# Patient Record
Sex: Male | Born: 1985 | Race: White | Hispanic: No | Smoking: Current every day smoker
Health system: Southern US, Community
[De-identification: ages and names within clinical notes are randomized; demographics above are authoritative.]

## PROBLEM LIST (undated history)

## (undated) ENCOUNTER — Emergency Department (HOSPITAL_COMMUNITY): Payer: Medicaid Other

## (undated) DIAGNOSIS — I10 Essential (primary) hypertension: Secondary | ICD-10-CM

## (undated) DIAGNOSIS — B1920 Unspecified viral hepatitis C without hepatic coma: Secondary | ICD-10-CM

---

## 2019-09-13 ENCOUNTER — Encounter (HOSPITAL_COMMUNITY): Payer: Self-pay | Admitting: Psychiatry

## 2019-09-13 ENCOUNTER — Inpatient Hospital Stay (HOSPITAL_COMMUNITY)
Admission: RE | Admit: 2019-09-13 | Discharge: 2019-09-18 | DRG: 885 | Disposition: A | Discharge status: Home / Self Care | Payer: Federal, State, Local not specified - Other | Attending: Psychiatry | Admitting: Psychiatry

## 2019-09-13 DIAGNOSIS — F333 Major depressive disorder, recurrent, severe with psychotic symptoms: Principal | ICD-10-CM | POA: Diagnosis present

## 2019-09-13 DIAGNOSIS — F15159 Other stimulant abuse with stimulant-induced psychotic disorder, unspecified: Secondary | ICD-10-CM | POA: Diagnosis present

## 2019-09-13 DIAGNOSIS — Z8249 Family history of ischemic heart disease and other diseases of the circulatory system: Secondary | ICD-10-CM

## 2019-09-13 DIAGNOSIS — F23 Brief psychotic disorder: Secondary | ICD-10-CM | POA: Diagnosis present

## 2019-09-13 DIAGNOSIS — F17210 Nicotine dependence, cigarettes, uncomplicated: Secondary | ICD-10-CM | POA: Diagnosis present

## 2019-09-13 DIAGNOSIS — Z20822 Contact with and (suspected) exposure to covid-19: Secondary | ICD-10-CM | POA: Diagnosis present

## 2019-09-13 DIAGNOSIS — G4700 Insomnia, unspecified: Secondary | ICD-10-CM | POA: Diagnosis present

## 2019-09-13 DIAGNOSIS — Z818 Family history of other mental and behavioral disorders: Secondary | ICD-10-CM

## 2019-09-13 DIAGNOSIS — I10 Essential (primary) hypertension: Secondary | ICD-10-CM | POA: Diagnosis present

## 2019-09-13 DIAGNOSIS — F323 Major depressive disorder, single episode, severe with psychotic features: Secondary | ICD-10-CM

## 2019-09-13 DIAGNOSIS — F600 Paranoid personality disorder: Secondary | ICD-10-CM | POA: Diagnosis present

## 2019-09-13 LAB — RESPIRATORY PANEL BY RT PCR (FLU A&B, COVID)
Influenza A by PCR: NEGATIVE
Influenza B by PCR: NEGATIVE
SARS Coronavirus 2 by RT PCR: NEGATIVE

## 2019-09-13 MED ORDER — TRAZODONE HCL 100 MG PO TABS
100.0000 mg | ORAL_TABLET | Freq: Every day | ORAL | Status: DC
  Filled 2019-09-13 (×2): qty 1

## 2019-09-13 MED ORDER — MAGNESIUM HYDROXIDE 400 MG/5ML PO SUSP: 30.0000 mL | Freq: Every day | ORAL | Status: DC | PRN

## 2019-09-13 MED ORDER — ACETAMINOPHEN 325 MG PO TABS
650.0000 mg | ORAL_TABLET | Freq: Four times a day (QID) | ORAL | Status: DC | PRN
  Administered 2019-09-15: 08:00:00 650 mg via ORAL
  Filled 2019-09-13: qty 2

## 2019-09-13 MED ORDER — ALUM & MAG HYDROXIDE-SIMETH 200-200-20 MG/5ML PO SUSP: 30.0000 mL | ORAL | Status: DC | PRN

## 2019-09-13 MED ORDER — NICOTINE 21 MG/24HR TD PT24
21.0000 mg | MEDICATED_PATCH | Freq: Every day | TRANSDERMAL | Status: DC
  Administered 2019-09-14 – 2019-09-15 (×2): 21 mg via TRANSDERMAL
  Filled 2019-09-13 (×8): qty 1

## 2019-09-13 MED ORDER — HYDROXYZINE HCL 25 MG PO TABS
25.0000 mg | ORAL_TABLET | Freq: Four times a day (QID) | ORAL | Status: DC | PRN
  Administered 2019-09-13 – 2019-09-15 (×3): 25 mg via ORAL
  Filled 2019-09-13 (×2): qty 1
  Filled 2019-09-13: qty 10
  Filled 2019-09-13: qty 1

## 2019-09-13 MED ORDER — QUETIAPINE FUMARATE 100 MG PO TABS
100.0000 mg | ORAL_TABLET | Freq: Every day | ORAL | Status: DC
  Administered 2019-09-15 – 2019-09-17 (×3): 100 mg via ORAL
  Filled 2019-09-13: qty 7
  Filled 2019-09-13 (×9): qty 1

## 2019-09-13 MED ORDER — QUETIAPINE FUMARATE 50 MG PO TABS
50.0000 mg | ORAL_TABLET | Freq: Once | ORAL | Status: DC
  Administered 2019-09-13: 16:00:00 50 mg via ORAL
  Filled 2019-09-13: qty 1

## 2019-09-13 MED ORDER — LISINOPRIL 10 MG PO TABS
10.0000 mg | ORAL_TABLET | Freq: Every day | ORAL | Status: DC
  Administered 2019-09-13 – 2019-09-18 (×5): 10 mg via ORAL
  Filled 2019-09-13: qty 7
  Filled 2019-09-13 (×6): qty 1
  Filled 2019-09-13: qty 2

## 2019-09-13 MED ORDER — LORAZEPAM 2 MG/ML PO CONC: 2.0000 mg | Freq: Four times a day (QID) | ORAL | Status: DC | PRN

## 2019-09-13 MED ORDER — ZIPRASIDONE MESYLATE 20 MG IM SOLR
20.0000 mg | Freq: Two times a day (BID) | INTRAMUSCULAR | Status: DC | PRN
  Administered 2019-09-13: 16:00:00 20 mg via INTRAMUSCULAR
  Filled 2019-09-13: qty 20

## 2019-09-13 NOTE — Discharge Summary (Signed)
  Patient to be transferred to [facility] inpatient for psychiatric treatment 

## 2019-09-13 NOTE — Progress Notes (Addendum)
Pt accepted to [facility] Adult Unit;  301, bed 1.   NP,Shuvon Rankin is the accepting provider.    Dr. Jama Flavors is the attending provider.    Call report to [phone].    Pt is Voluntary.    Patient may be admitted to the unit after he receives negative COVID screen.   Drucilla Schmidt, MSW, LCSW-A Clinical Disposition Social Worker [employer] [facility]/TTS [phone]

## 2019-09-13 NOTE — BH Assessment (Addendum)
Assessment Note  Brad Pollard is an 34 y.o. male who was self-referred to [facility] for extreme paranoia.  Patient states that he has a history of heroin addiction, but states that he was in prison over the past year for drug charges and states that he was released from prison 08/18/2019 and states that he has not used any since. He states that he currently resides in the [facility] here in [place]. Patient states that he has been paranoid for the past three days thinking that his brother is out to get him and wants to hurt him.  Patient admits that he used Adderall and drank beer three days ago.  Patient states that he has no history of mental illness and has never experienced any symptoms like this before.  Patient denies any history of metal illness treatment, but states that he has been in substance abuse programs while he was in jail/prison on several different occasions. Patient states that he has never been suicidal or homicidal and denies any AVH.  Patient states that he has not been sleeping or eating well for several days.  He denies any history of abuse or self-mutilation.  Patient presents as alert and oriented.  He is very paranoid and restless and somewhat agitated.  He does not appear to be responding to any internal stimuli.  His judgment, insight and impulse control appear to be impaired.  His eye contact is fair and his speech coherent.  His thoughts are mostly organized and his memory intact.  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   Family History: No family history on file.  Social History:  reports that he has been smoking. He does not have any smokeless tobacco history on file. He reports current alcohol use. He reports current drug use. Drugs: Amphetamines and Heroin.  Additional Social History:  Alcohol / Drug Use Pain Medications: see MAR Prescriptions: see MAR Over the Counter: see  MAR History of alcohol / drug use?: Yes Longest period of sobriety (when/how long): clean from heroin for the past year Negative Consequences of Use: Legal, Personal relationships, Work / School Substance #1 Name of Substance 1: heroin 1 - Age of First Use: UTA 1 - Amount (size/oz): UTA 1 - Frequency: None 1 - Duration: UTA 1 - Last Use / Amount: Last use was one year ago Substance #2 Name of Substance 2: alcohol 2 - Age of First Use: UTA 2 - Amount (size/oz): 2-3 beers 2 - Frequency: on occasion 2 - Duration: UTA 2 - Last Use / Amount: 3 nights ago Substance #3 Name of Substance 3: adderall 3 - Age of First Use: 33 3 - Amount (size/oz): 1 pill 3 - Frequency: states that he used it on one occasion 3 - Duration: 1 time 3 - Last Use / Amount: 3 days ago  CIWA: CIWA-Ar BP: (!) 137/98 Pulse Rate: (!) 117 COWS:    Allergies: Not on File  Home Medications:  No medications prior to admission.    OB/GYN Status:  No LMP for male patient.  General Assessment Data Location of Assessment: [facility] Assessment Services TTS Assessment: In system Is this a Tele or Face-to-Face Assessment?: Face-to-Face Is this an Initial Assessment or a Re-assessment for this encounter?: Initial Assessment Patient Accompanied by:: N/A Language Other than English: No Living Arrangements: Jail/Detention([facility]) What gender do you identify as?: Male Marital status: Widowed Living Arrangements: Other (Comment)([employer]) Can pt  return to current living arrangement?: Yes Admission Status: Voluntary Is patient capable of signing voluntary admission?: Yes Referral Source: Self/Family/Friend Insurance type: self-pay  Medical Screening Exam [facility] Walk-in ONLY) Medical Exam completed: Yes  Crisis Care Plan Living Arrangements: Other (Comment)([facility]) Legal Guardian: Other:(self) Name of Psychiatrist: none Name of Therapist: none  Education Status Is patient  currently in school?: No Is the patient employed, unemployed or receiving disability?: Unemployed  Risk to self with the past 6 months Suicidal Ideation: No Has patient been a risk to self within the past 6 months prior to admission? : No Suicidal Intent: No-Not Currently/Within Last 6 Months Has patient had any suicidal intent within the past 6 months prior to admission? : No Is patient at risk for suicide?: No Suicidal Plan?: No-Not Currently/Within Last 6 Months Has patient had any suicidal plan within the past 6 months prior to admission? : No Access to Means: No What has been your use of drugs/alcohol within the last 12 months?: alcohol and amphetamines Previous Attempts/Gestures: No How many times?: 0 Other Self Harm Risks: psychosis Triggers for Past Attempts: None known Intentional Self Injurious Behavior: None Family Suicide History: No Recent stressful life event(s): Other (Comment)(just released from [employer]) Persecutory voices/beliefs?: Yes(brother's voice) Depression: Yes Depression Symptoms: Despondent, Isolating, Loss of interest in usual pleasures, Feeling worthless/self pity Substance abuse history and/or treatment for substance abuse?: Yes Suicide prevention information given to non-admitted patients: Not applicable  Risk to Others within the past 6 months Homicidal Ideation: No Does patient have any lifetime risk of violence toward others beyond the six months prior to admission? : No Thoughts of Harm to Others: No Current Homicidal Intent: No Current Homicidal Plan: No-Not Currently/Within Last 6 Months Access to Homicidal Means: No Identified Victim: none History of harm to others?: No Assessment of Violence: None Noted Violent Behavior Description: none Does patient have access to weapons?: No Criminal Charges Pending?: No Does patient have a court date: No Is patient on probation?: Yes  Psychosis Hallucinations: Auditory Delusions: (paranoid  delusions)  Mental Status Report Appearance/Hygiene: Unremarkable Eye Contact: Fair Motor Activity: Restlessness Speech: Logical/coherent Level of Consciousness: Alert Mood: Suspicious, Ambivalent, Apprehensive Affect: Anxious, Appropriate to circumstance Anxiety Level: Severe Thought Processes: (having paranoid thoughts) Judgement: Impaired Orientation: Person, Place, Time Obsessive Compulsive Thoughts/Behaviors: Severe  Cognitive Functioning Concentration: Decreased Memory: Recent Intact, Remote Intact Is patient IDD: No Insight: Fair Impulse Control: Poor Appetite: Good Have you had any weight changes? : No Change Sleep: No Change Total Hours of Sleep: (UTA) Vegetative Symptoms: None  ADLScreening [facility] Assessment Services) Patient's cognitive ability adequate to safely complete daily activities?: Yes Patient able to express need for assistance with ADLs?: Yes Independently performs ADLs?: Yes (appropriate for developmental age)  Prior Inpatient Therapy Prior Inpatient Therapy: No(patient states that he has done drug programs in treatment)  Prior Outpatient Therapy Prior Outpatient Therapy: No Does patient have an ACCT team?: No Does patient have Intensive In-House Services?  : No Does patient have [facility] services? : No Does patient have P4CC services?: No  ADL Screening (condition at time of admission) Patient's cognitive ability adequate to safely complete daily activities?: Yes Is the patient deaf or have difficulty hearing?: No Does the patient have difficulty seeing, even when wearing glasses/contacts?: No Does the patient have difficulty concentrating, remembering, or making decisions?: No Patient able to express need for assistance with ADLs?: Yes Does the patient have difficulty dressing or bathing?: No Independently performs ADLs?: Yes (appropriate for developmental  age) Does the patient have difficulty walking or climbing stairs?: No Weakness of Legs:  None Weakness of Arms/Hands: None  Home Assistive Devices/Equipment Home Assistive Devices/Equipment: None  Therapy Consults (therapy consults require a physician order) PT Evaluation Needed: No OT Evalulation Needed: No SLP Evaluation Needed: No Abuse/Neglect Assessment (Assessment to be complete while patient is alone) Abuse/Neglect Assessment Can Be Completed: Yes Physical Abuse: Denies Verbal Abuse: Denies Sexual Abuse: Denies Exploitation of patient/patient's resources: Denies Values / Beliefs Cultural Requests During Hospitalization: None Spiritual Requests During Hospitalization: None Consults Spiritual Care Consult Needed: No Transition of Care Team Consult Needed: No Advance Directives (For Healthcare) Does Patient Have a Medical Advance Directive?: No Would patient like information on creating a medical advance directive?: No - Patient declined Nutrition Screen- [facility] Adult/WL/AP Has the patient recently lost weight without trying?: No Has the patient been eating poorly because of a decreased appetite?: No Malnutrition Screening Tool Score: 0        Disposition: Per Brad Spar, NP, patient will need to be admitted to OBS and monitored for safety overnight.  Disposition Initial Assessment Completed for this Encounter: Yes Disposition of Patient: (Admit to OBS to monitor for safety overnight)  On Site Evaluation by:   Reviewed with Physician:    Brad Pollard 09/13/2019 3:30 PM

## 2019-09-13 NOTE — H&P (Addendum)
Behavioral Health Medical Screening Exam  Brad Pollard is a 34 y.o. Caucasian  male with prior hx of substance use disorders. He came to the [facility] as a walk-in seeking mental health evaluation for feeling of paranoia that started 3 days ago after ingestion of 1 tablet of Adderrall that he got from someone else. He says he took the Adderral for anxiety symptoms.  During this medical screening evaluation, Brad reports, "I was recently released from prison on 08-18-19 after serving 11 months. I'm currently living in a half-way house. Three days ago, I took a tablet of Adderall from someone because I had bad anxiety. After taking the Adderrall, I started to feel paranoid. It felt like my brother is out to get me. My brother is a dangerous person. Since coming out of prison, I have not used drugs. But, I did drink few cans of beer few days ago. I currently live n a half-way house, but, I'm not doing okay there. I sleep very poorly at night & I keep hearing these voices in my head since 3 days ago. I do believe that my brother had Bipolar disorder".  Total Time spent with patient: 30 minutes  Psychiatric Specialty Exam: Physical Exam  Nursing note and vitals reviewed. Cardiovascular:  Reports hx. HTN  Respiratory: Effort normal.  Genitourinary:    Genitourinary Comments: Deferred   Musculoskeletal:        General: Normal range of motion.     Cervical back: Normal range of motion.  Neurological: He is alert.  Skin: Skin is warm.  Several tattoos to arm areas.    Review of Systems  Constitutional: Negative for chills, diaphoresis and fever.  HENT: Negative for congestion, rhinorrhea, sneezing and sore throat.   Respiratory: Negative for cough, shortness of breath and wheezing.   Cardiovascular: Negative for chest pain and palpitations.  Gastrointestinal: Negative for vomiting.  Genitourinary: Negative for difficulty urinating.  Skin: Negative for color change.       Several tattoos to arm areas.   Neurological: Negative for dizziness and headaches.  Psychiatric/Behavioral: Positive for decreased concentration, dysphoric mood, hallucinations and sleep disturbance. Negative for agitation, behavioral problems, confusion, self-injury and suicidal ideas. The patient is nervous/anxious and is hyperactive.     Blood pressure (!) 137/98, pulse (!) 117, temperature 97.9 F (36.6 C), temperature source Oral, resp. rate 18, SpO2 100 %.There is no height or weight on file to calculate BMI.  General Appearance: Disheveles, restless  Eye Contact:  Minimal  Speech:  Garbled & pressured, difficult to understand at times.  Volume:  Increased  Mood:  Anxious, Dysphoric and Restless  Affect:  Restricted  Thought Process:  Disorganized and Descriptions of Associations: Tangential  Orientation:  Full (Time, Place, and Person)  Thought Content:  Illogical, Paranoid Ideation and Rumination  Suicidal Thoughts:  Denies any thoughts, plans or intent, however, paranoid that his brother may kill him.  Homicidal Thoughts:  Denies  Memory:  Immediate;   Fair Recent;   Fair Remote;   Fair  Judgement:  Impaired  Insight:  Limited  Psychomotor Activity:  Mannerisms and Restlessness  Concentration: Concentration: Poor and Attention Span: Poor  Recall:  [employer] of Knowledge:Fair  Language: Fair  Akathisia:  Negative  Handed:  Right  AIMS (if indicated):     Assets:  Desire for Improvement Physical Health Resilience  Sleep:      Musculoskeletal: Strength & Muscle Tone: within normal limits Gait & Station: normal Patient leans: N/A  Blood  pressure (!) 137/98, pulse (!) 117, temperature 97.9 F (36.6 C), temperature source Oral, resp. rate 18, SpO2 100 %.  Recommendations: Admit to the Observation unit x 24 hours, if symptoms did not improve or patient continues to display paranoid behaviors, consider inpatient admission for mental health stabilization.  -Started on Seroquel 100 mg po Q hs,   -Psychosis/agitation protocols with Geodon 20 mg IM Q 12 hrs & Ativan 2 mg po IM once. -Ordered some lab works. -Started on Lisinopril 10 mg po dasily for elevated blood pressure.  Based on my evaluation the patient does not appear to have an emergency medical condition.  Lindell Spar, NP, PMHNP, FNP-BC 09/13/2019, 3:38 PM   Attest to NP Note

## 2019-09-14 ENCOUNTER — Other Ambulatory Visit: Payer: Self-pay

## 2019-09-14 ENCOUNTER — Encounter (HOSPITAL_COMMUNITY): Payer: Self-pay | Admitting: Psychiatry

## 2019-09-14 DIAGNOSIS — F333 Major depressive disorder, recurrent, severe with psychotic symptoms: Secondary | ICD-10-CM

## 2019-09-14 DIAGNOSIS — F323 Major depressive disorder, single episode, severe with psychotic features: Secondary | ICD-10-CM

## 2019-09-14 LAB — LIPID PANEL
Cholesterol: 111 mg/dL (ref 0–200)
HDL: 46 mg/dL (ref >40)
LDL Cholesterol: 54 mg/dL (ref 0–99)
Total CHOL/HDL Ratio: 2.4 RATIO
Triglycerides: 56 mg/dL (ref <150)
VLDL: 11 mg/dL (ref 0–40)

## 2019-09-14 LAB — CBC
HCT: 39.3 % (ref 39.0–52.0)
Hemoglobin: 12.9 g/dL — ABNORMAL LOW (ref 13.0–17.0)
MCH: 29.7 pg (ref 26.0–34.0)
MCHC: 32.8 g/dL (ref 30.0–36.0)
MCV: 90.6 fL (ref 80.0–100.0)
Platelets: 245 10*3/uL (ref 150–400)
RBC: 4.34 MIL/uL (ref 4.22–5.81)
RDW: 13.9 % (ref 11.5–15.5)
WBC: 8.6 10*3/uL (ref 4.0–10.5)
nRBC: 0 % (ref 0.0–0.2)

## 2019-09-14 LAB — COMPREHENSIVE METABOLIC PANEL
ALT: 32 U/L (ref 0–44)
AST: 27 U/L (ref 15–41)
Albumin: 3.5 g/dL (ref 3.5–5.0)
Alkaline Phosphatase: 56 U/L (ref 38–126)
Anion gap: 9 (ref 5–15)
BUN: 7 mg/dL (ref 6–20)
CO2: 25 mmol/L (ref 22–32)
Calcium: 9.1 mg/dL (ref 8.9–10.3)
Chloride: 107 mmol/L (ref 98–111)
Creatinine, Ser: 0.73 mg/dL (ref 0.61–1.24)
GFR calc Af Amer: >60 mL/min (ref >60)
GFR calc non Af Amer: >60 mL/min (ref >60)
Glucose, Bld: 97 mg/dL (ref 70–99)
Potassium: 3.4 mmol/L — ABNORMAL LOW (ref 3.5–5.1)
Sodium: 141 mmol/L (ref 135–145)
Total Bilirubin: 1 mg/dL (ref 0.3–1.2)
Total Protein: 6.6 g/dL (ref 6.5–8.1)

## 2019-09-14 LAB — HEMOGLOBIN A1C
Hgb A1c MFr Bld: 5.5 % (ref 4.8–5.6)
Mean Plasma Glucose: 111.15 mg/dL

## 2019-09-14 LAB — ETHANOL: Alcohol, Ethyl (B): <10 mg/dL (ref <10)

## 2019-09-14 LAB — TSH: TSH: 0.366 u[IU]/mL (ref 0.350–4.500)

## 2019-09-14 MED ORDER — MIRTAZAPINE 15 MG PO TABS
15.0000 mg | ORAL_TABLET | Freq: Every day | ORAL | Status: DC
  Filled 2019-09-14 (×2): qty 1

## 2019-09-14 MED ORDER — PRAZOSIN HCL 1 MG PO CAPS
1.0000 mg | ORAL_CAPSULE | Freq: Every day | ORAL | Status: DC
  Filled 2019-09-14 (×2): qty 1

## 2019-09-14 MED ORDER — QUETIAPINE FUMARATE 25 MG PO TABS
25.0000 mg | ORAL_TABLET | Freq: Two times a day (BID) | ORAL | Status: DC
  Filled 2019-09-14 (×4): qty 1

## 2019-09-14 MED ORDER — QUETIAPINE FUMARATE 25 MG PO TABS
25.0000 mg | ORAL_TABLET | ORAL | Status: DC
  Administered 2019-09-14 – 2019-09-18 (×5): 25 mg via ORAL
  Filled 2019-09-14 (×4): qty 1
  Filled 2019-09-14: qty 7
  Filled 2019-09-14 (×4): qty 1

## 2019-09-14 MED ORDER — OLANZAPINE 10 MG PO TBDP
10.0000 mg | ORAL_TABLET | Freq: Three times a day (TID) | ORAL | Status: DC | PRN
  Administered 2019-09-14: 12:00:00 10 mg via ORAL
  Filled 2019-09-14: qty 1

## 2019-09-14 MED ORDER — LORAZEPAM 1 MG PO TABS
1.0000 mg | ORAL_TABLET | ORAL | Status: AC | PRN
  Administered 2019-09-16: 22:00:00 1 mg via ORAL
  Filled 2019-09-14: qty 1

## 2019-09-14 MED ORDER — SERTRALINE HCL 50 MG PO TABS
50.0000 mg | ORAL_TABLET | Freq: Every day | ORAL | Status: DC
  Administered 2019-09-14 – 2019-09-18 (×5): 50 mg via ORAL
  Filled 2019-09-14 (×6): qty 1
  Filled 2019-09-14: qty 7
  Filled 2019-09-14: qty 1

## 2019-09-14 MED ORDER — LORAZEPAM 1 MG PO TABS
2.0000 mg | ORAL_TABLET | Freq: Four times a day (QID) | ORAL | Status: DC | PRN
  Administered 2019-09-14: 10:00:00 2 mg via ORAL
  Filled 2019-09-14: qty 2

## 2019-09-14 MED ORDER — ZIPRASIDONE MESYLATE 20 MG IM SOLR: 20.0000 mg | INTRAMUSCULAR | Status: DC | PRN

## 2019-09-14 NOTE — BH Assessment (Signed)
BHH Assessment Progress Note  Per Nehemiah Massed, MD, this pt requires psychiatric hospitalization at this time. Pt has been assigned pt to [facility] Rm 306-1.  Pt's nurse, Lincoln Maxin, has been notified.  Doylene Canning, [place] Behavioral Health Coordinator [phone]

## 2019-09-14 NOTE — Progress Notes (Signed)
CSW received legal documentation (Transfer Order Form) from the [employer] [facility] [employer] stating the patient is currently in the custody of the [place] [employer].   The patient's halfway house director, Director Brooke Dare [phone]) requested to be notified once the patient's discharge date is known. Patient has to return to [employer] custody as soon as he is discharged.    Copies of documentation provided to [facility] security and placed in the patient's chart.   Percell Boston, RN/Nursing Director notified [facility] Security notified Dr. Nehemiah Massed, MD notified.      Baldo Daub, MSW, LCSW Clinical Social Worker [facility]  Phone: [phone]
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
## 2019-09-14 NOTE — Progress Notes (Signed)
Patient informed that a Brad Pollard was calling to confirm or deny if he was a patient at this facility.  Mr. Brad Pollard stated that the patient is a federal prisoner and did not return to the federal prisoner half way house as required according to his terms of release.  Mr. Brad Pollard was informed that we could not confirm or deny that patient was here.  Patient was provided with Mr. Brad Pollard name and call back number of [phone].

## 2019-09-14 NOTE — Progress Notes (Signed)
Pt has been a sleep since the beginning of the shift, pt woken up for skin search at about 1 am and went back to sleep after wards. Pt  scheduled to go to 300 hall for impatient admission, pt remains safe on the unit at this time, will continue to monitor.

## 2019-09-14 NOTE — Progress Notes (Signed)
Adult unit admission note  Pt is a 33 yo male that presents voluntarily to [facility]BS on 09/13/19 with worsening paranoia and anxiety. Pt was given medication and brought to the adult unit on 09/14/2019. Pt was appropriate with no signs of paranoia. Pt was still anxious. Pt states they were living at a [facility] house. Pt states they were recently released from prison for 4 years. Pt denies any past/present verbal/physical/sexual abuse. Pt denies a pcp. Pt denies self neglect. Pt endorses their mother as a support system. Pt denies si/hi/ah/vh and verbally agrees to approach staff if these become apparent or before harming themself/others while at bhh. Pt has been resting most of the day. Consents signed, skin/belongings search completed and patient oriented to unit. Patient stable at this time. Patient given the opportunity to express concerns and ask questions. Patient given toiletries. Will continue to monitor.   [facility] Assessment 09/13/19:  Brad Pollard is an 34 y.o. male who was self-referred to [facility] for extreme paranoia.  Patient states that he has a history of heroin addiction, but states that he was in prison over the past year for drug charges and states that he was released from prison 08/18/2019 and states that he has not used any since. He states that he currently resides in the [facility] here in [place]. Patient states that he has been paranoid for the past three days thinking that his brother is out to get him and wants to hurt him.  Patient admits that he used Adderall and drank beer three days ago.  Patient states that he has no history of mental illness and has never experienced any symptoms like this before.  Patient denies any history of metal illness treatment, but states that he has been in substance abuse programs while he was in jail/prison on several different occasions. Patient states that he has never been suicidal or homicidal and denies any AVH.  Patient states that he  has not been sleeping or eating well for several days.  He denies any history of abuse or self-mutilation.  Patient presents as alert and oriented.  He is very paranoid and restless and somewhat agitated.  He does not appear to be responding to any internal stimuli.  His judgment, insight and impulse control appear to be impaired.  His eye contact is fair and his speech coherent.  His thoughts are mostly organized and his memory intact.  

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
## 2019-09-14 NOTE — Tx Team (Signed)
Initial Treatment Plan 09/14/2019 1:50 PM Brad Pollard VXB:[id number]    PATIENT STRESSORS: Financial difficulties Occupational concerns Substance abuse   PATIENT STRENGTHS: Ability for insight Active sense of humor Capable of independent living Communication skills Motivation for treatment/growth Physical Health Supportive family/friends   PATIENT IDENTIFIED PROBLEMS: anxiety  depression  Substance abuse  psychosis               DISCHARGE CRITERIA:  Ability to meet basic life and health needs Adequate post-discharge living arrangements Improved stabilization in mood, thinking, and/or behavior Motivation to continue treatment in a less acute level of care  PRELIMINARY DISCHARGE PLAN: Attend aftercare/continuing care group Outpatient therapy Return to previous living arrangement  PATIENT/FAMILY INVOLVEMENT: This treatment plan has been presented to and reviewed with the patient, Brad Pollard.  The patient and family have been given the opportunity to ask questions and make suggestions.  Raylene Miyamoto, RN 09/14/2019, 1:50 PM

## 2019-09-14 NOTE — [facility] Suicide Risk Assessment (Signed)
[facility] INPATIENT:  Family/Significant Other Suicide Prevention Education  Suicide Prevention Education:  Patient Refusal for Family/Significant Other Suicide Prevention Education: The patient Brad Pollard has refused to provide written consent for family/significant other to be provided Family/Significant Other Suicide Prevention Education during admission and/or prior to discharge.  Physician notified.  SPE provided to patient.  Darreld Mclean 09/14/2019, 10:58 AM
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
## 2019-09-15 DIAGNOSIS — F333 Major depressive disorder, recurrent, severe with psychotic symptoms: Principal | ICD-10-CM

## 2019-09-15 NOTE — Tx Team (Signed)
Interdisciplinary Treatment and Diagnostic Plan Update  09/15/2019 Time of Session:  Brad Pollard MRN: [id number]  Principal Diagnosis: Severe recurrent major depressive disorder with psychotic features [facility])  Secondary Diagnoses: Principal Problem:   Severe recurrent major depressive disorder with psychotic features ([place]) Active Problems:   Acute psychosis ([place])   Major depressive disorder, single episode, severe with psychotic features ([place])   Current Medications:  Current Facility-Administered Medications  Medication Dose Route Frequency Provider Last Rate Last Admin  . acetaminophen (TYLENOL) tablet 650 mg  650 mg Oral Q6H PRN Lindell Spar I, NP   650 mg at 09/15/19 0825  . alum & mag hydroxide-simeth (MAALOX/MYLANTA) 200-200-20 MG/5ML suspension 30 mL  30 mL Oral Q4H PRN Nwoko, Agnes I, NP      . hydrOXYzine (ATARAX/VISTARIL) tablet 25 mg  25 mg Oral Q6H PRN Lindell Spar I, NP   25 mg at 09/15/19 0825  . lisinopril (ZESTRIL) tablet 10 mg  10 mg Oral Daily Lindell Spar I, NP   10 mg at 09/15/19 6803  . OLANZapine zydis (ZYPREXA) disintegrating tablet 10 mg  10 mg Oral Q8H PRN Cobos, Myer Peer, MD   10 mg at 09/14/19 1223   And  . LORazepam (ATIVAN) tablet 1 mg  1 mg Oral PRN Cobos, Myer Peer, MD       And  . ziprasidone (GEODON) injection 20 mg  20 mg Intramuscular PRN Cobos, Myer Peer, MD      . magnesium hydroxide (MILK OF MAGNESIA) suspension 30 mL  30 mL Oral Daily PRN Nwoko, Agnes I, NP      . nicotine (NICODERM CQ - dosed in mg/24 hours) patch 21 mg  21 mg Transdermal Q0600 Lindell Spar I, NP   21 mg at 09/15/19 0619  . QUEtiapine (SEROQUEL) tablet 100 mg  100 mg Oral QHS Nwoko, Agnes I, NP      . QUEtiapine (SEROQUEL) tablet 25 mg  25 mg Oral BH-q7a Cobos, Myer Peer, MD   25 mg at 09/15/19 2122  . sertraline (ZOLOFT) tablet 50 mg  50 mg Oral Daily Cobos, Myer Peer, MD   50 mg at 09/15/19 4825   PTA Medications: Medications Prior to Admission  Medication Sig Dispense  Refill Last Dose  . LISINOPRIL PO Take 1 tablet by mouth daily.       Patient Stressors: Financial difficulties Occupational concerns Substance abuse  Patient Strengths: Ability for insight Active sense of humor Capable of independent living Communication skills Motivation for treatment/growth Physical Health Supportive family/friends  Treatment Modalities: Medication Management, Group therapy, Case management,  1 to 1 session with clinician, Psychoeducation, Recreational therapy.   Physician Treatment Plan for Primary Diagnosis: Severe recurrent major depressive disorder with psychotic features ([place]) Long Term Goal(s): Improvement in symptoms so as ready for discharge Improvement in symptoms so as ready for discharge   Short Term Goals: Ability to verbalize feelings will improve Ability to demonstrate self-control will improve Ability to identify and develop effective coping behaviors will improve Ability to identify and develop effective coping behaviors will improve Compliance with prescribed medications will improve Ability to identify triggers associated with substance abuse/mental health issues will improve  Medication Management: Evaluate patient's response, side effects, and tolerance of medication regimen.  Therapeutic Interventions: 1 to 1 sessions, Unit Group sessions and Medication administration.  Evaluation of Outcomes: Not Met  Physician Treatment Plan for Secondary Diagnosis: Principal Problem:   Severe recurrent major depressive disorder with psychotic features ([place]) Active Problems:   Acute psychosis (  [place])   Major depressive disorder, single episode, severe with psychotic features ([place])  Long Term Goal(s): Improvement in symptoms so as ready for discharge Improvement in symptoms so as ready for discharge   Short Term Goals: Ability to verbalize feelings will improve Ability to demonstrate self-control will improve Ability to identify and develop  effective coping behaviors will improve Ability to identify and develop effective coping behaviors will improve Compliance with prescribed medications will improve Ability to identify triggers associated with substance abuse/mental health issues will improve     Medication Management: Evaluate patient's response, side effects, and tolerance of medication regimen.  Therapeutic Interventions: 1 to 1 sessions, Unit Group sessions and Medication administration.  Evaluation of Outcomes: Not Met   RN Treatment Plan for Primary Diagnosis: Severe recurrent major depressive disorder with psychotic features ([place]) Long Term Goal(s): Knowledge of disease and therapeutic regimen to maintain health will improve  Short Term Goals: Ability to participate in decision making will improve, Ability to disclose and discuss suicidal ideas, Ability to identify and develop effective coping behaviors will improve and Compliance with prescribed medications will improve  Medication Management: RN will administer medications as ordered by provider, will assess and evaluate patient's response and provide education to patient for prescribed medication. RN will report any adverse and/or side effects to prescribing provider.  Therapeutic Interventions: 1 on 1 counseling sessions, Psychoeducation, Medication administration, Evaluate responses to treatment, Monitor vital signs and CBGs as ordered, Perform/monitor CIWA, COWS, AIMS and Fall Risk screenings as ordered, Perform wound care treatments as ordered.  Evaluation of Outcomes: Not Met   LCSW Treatment Plan for Primary Diagnosis: Severe recurrent major depressive disorder with psychotic features ([place]) Long Term Goal(s): Safe transition to appropriate next level of care at discharge, Engage patient in therapeutic group addressing interpersonal concerns.  Short Term Goals: Engage patient in aftercare planning with referrals and resources  Therapeutic Interventions:  Assess for all discharge needs, 1 to 1 time with Social worker, Explore available resources and support systems, Assess for adequacy in community support network, Educate family and significant other(s) on suicide prevention, Complete Psychosocial Assessment, Interpersonal group therapy.  Evaluation of Outcomes: Not Met   Progress in Treatment: Attending groups: No. Participating in groups: No. Taking medication as prescribed: Yes. Toleration medication: Yes. Family/Significant other contact made: No, will contact:  no one, patient declined consent Patient understands diagnosis: Yes. Discussing patient identified problems/goals with staff: Yes. Medical problems stabilized or resolved: Yes. Denies suicidal/homicidal ideation: Yes. Issues/concerns per patient self-inventory: No. Other:   New problem(s) identified: None   New Short Term/Long Term Goal(s): Detox, medication stabilization, elimination of SI thoughts, development of comprehensive mental wellness plan.    Patient Goals:    Discharge Plan or Barriers: Patient recently admitted. CSW will continue to follow and assess for appropriate referrals and possible discharge planning.    Reason for Continuation of Hospitalization: Anxiety Depression Hallucinations Medication stabilization  Estimated Length of Stay: 3-5 days  Attendees: Patient: 09/15/2019 10:29 AM  Physician: Dr. Neita Garnet, MD 09/15/2019 10:29 AM  Nursing: Rise Paganini, RN 09/15/2019 10:29 AM  RN Care Manager: 09/15/2019 10:29 AM  Social Worker: Radonna Ricker, LCSW 09/15/2019 10:29 AM  Recreational Therapist:  09/15/2019 10:29 AM  Other:  09/15/2019 10:29 AM  Other:  09/15/2019 10:29 AM  Other: 09/15/2019 10:29 AM    Scribe for Treatment Team: Marylee Floras, [place] 09/15/2019 10:29 AM

## 2019-09-15 NOTE — Progress Notes (Signed)
Recreation Therapy Notes  Date:  1.22.21 Time: 0930 Location: [address] Group Room  Group Topic: Stress Management  Goal Area(s) Addresses:  Patient will identify positive stress management techniques. Patient will identify benefits of using stress management post d/c.  Intervention: Stress Management  Activity : Guided Imagery.  LRT read script that guided patients on a calming retreat on the beach to enjoy the peaceful waves.  Patients were to listen as script was read to engage in activity.  Education:  Stress Management, Discharge Planning.   Education Outcome: Acknowledges Education  Clinical Observations/Feedback: Pt did not attend activity.    Caroll Rancher, LRT/CTRS         Caroll Rancher A 09/15/2019 10:56 AM

## 2019-09-15 NOTE — Progress Notes (Signed)
   09/14/19 2000  Psych Admission Type (Psych Patients Only)  Admission Status Voluntary  Psychosocial Assessment  Patient Complaints None  Eye Contact Fair  Facial Expression Flat  Affect UTA  Speech UTA (pt sleeping)  Interaction Other (Comment) (pt asleep)  Motor Activity Other (Comment) (WNL)  Appearance/Hygiene Unremarkable  Behavior Characteristics Other (Comment) (pt asleep)  Mood Other (Comment)  Thought Process  Coherency Unable to assess  Content UTA  Delusions None reported or observed  Perception UTA  Hallucination UTA  Judgment UTA  Confusion UTA  Danger to Self  Current suicidal ideation? Denies  Danger to Others  Danger to Others None reported or observed   Pt asleep since beginning of shift. Q15 min safety checks conducted.

## 2019-09-15 NOTE — Progress Notes (Signed)
   09/15/19 2115  COVID-19 Daily Checkoff  Have you had a fever (temp > 37.80C/100F)  in the past 24 hours?  No  If you have had runny nose, nasal congestion, sneezing in the past 24 hours, has it worsened? No  COVID-19 EXPOSURE  Have you traveled outside the state in the past 14 days? No  Have you been in contact with someone with a confirmed diagnosis of COVID-19 or PUI in the past 14 days without wearing appropriate PPE? No  Have you been living in the same home as a person with confirmed diagnosis of COVID-19 or a PUI (household contact)? No  Have you been diagnosed with COVID-19? No   

## 2019-09-15 NOTE — Progress Notes (Addendum)
[facility] Progress Note  09/15/2019 1:53 PM Brad Pollard  MRN:  [id number]  Subjective: Brad reports, "It is all good. Everything is going well. My mood is really good. I feel great. So, when am I getting out of here?"  Objective: 34 year old Caucasian male with prior hx of polysubstance use disorders. [facility] with complaints of worsening paranoia. Patient reported has been hearing his brother's voice threatening to kill him for 3 days. He was seeking mental health stabilization. Patient was evaluated yesterday upon [facility]. He was admitted for 24 hour observation to determine if his current symptoms was as a result of drug use. Patient reported that 3 days prior to his symptoms, he has obtained & used some pill he thought was Adderral for his worsening anxiety. He later become psychotic. Brad is seen, chart reviewed. The chart findings discussed with the treatment team. He presents alert, oriented & aware of situation. He presents calm & cooperative. He says he is doing well. He denies any psychosis, symptoms of depression or anxiety. He is visible on the unit, attending group sessions.  He is taking & tolerating his treatment regimen. Denies any side effects. Brad reports today that he is feeling himself again. He feels he is back to his old self again. He says he realized now that he was tripping out of his mind when [facility]. He says he realized now that it was not his brother who was trying to hurt him, rather it was his own mind playing tricks on him. He says he has called hiss brother & offer him an apology. He says his brother was quite understanding. He says he feels bad because he accused his brother of trying to hurt him. He says he has also called the half-way house where he is residing & it is still available for him after discharge. He currently denies any SIHI, AVH, delusional thoughts or paranoia. He does not appear to be responding to any  internal stimuli. Brad is in agreement to continue his current plan of care as already in progress.  Principal Problem: Severe recurrent major depressive disorder with psychotic features (HCC)  Diagnosis: Principal Problem:   Severe recurrent major depressive disorder with psychotic features (HCC) Active Problems:   Acute psychosis (HCC)   Major depressive disorder, single episode, severe with psychotic features (HCC)  Total Time spent with patient: 25 minutes  Past Psychiatric History: See H&P  Past Medical History: History reviewed. No pertinent past medical history. History reviewed. No pertinent surgical history.  Family History: History reviewed. No pertinent family history.  Family Psychiatric  History: See H&P  Social History:  Social History   Substance and Sexual Activity  Alcohol Use Yes   Comment: fews beers on occasion     Social History   Substance and Sexual Activity  Drug Use Yes  . 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 Marital status: Single    Spouse name: Not on file  . Number of children: Not on file  . Years of education: Not on file  . Highest education level: Not on file  Occupational History  . Not on file  Tobacco Use  . Smoking status: Current Every Day Smoker    Packs/day: 1.00  . Smokeless tobacco: Never Used  Substance and Sexual  Activity  . Alcohol use: Yes    Comment: fews beers on occasion  . 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 Sexual activity: Not Currently  Other Topics Concern  . Not on file  Social History Narrative  . Not on file   Social Determinants of Health   Financial Resource Strain:   . Difficulty of Paying Living Expenses: Not on file  Food Insecurity:   . Worried About Programme researcher, broadcasting/film/video in the Last Year: Not on file  . Ran Out of  Food in the Last Year: Not on file  Transportation Needs:   . Lack of Transportation (Medical): Not on file  . Lack of Transportation (Non-Medical): Not on file  Physical Activity:   . Days of Exercise per Week: Not on file  . Minutes of Exercise per Session: Not on file  Stress:   . Feeling of Stress : Not on file  Social Connections:   . Frequency of Communication with Friends and Family: Not on file  . Frequency of Social Gatherings with Friends and Family: Not on file  . Attends Religious Services: Not on file  . Active Member of Clubs or Organizations: Not on file  . Attends Banker Meetings: Not on file  . Marital Status: Not on file   Additional Social History:  Pain Medications: see MAR Prescriptions: see MAR Over the Counter: see MAR History of alcohol / drug use?: Yes Longest period of sobriety (when/how long): clean from heroin for the past year Negative Consequences of Use: Legal, Personal relationships, Work / School Name of Substance 1: heroin 1 - Age of First Use: UTA 1 - Amount (size/oz): UTA 1 - Frequency: None 1 - Duration: UTA 1 - Last Use / Amount: Last use was one year ago Name of Substance 2: alcohol 2 - Age of First Use: UTA 2 - Amount (size/oz): 2-3 beers 2 - Frequency: on occasion 2 - Duration: UTA 2 - Last Use / Amount: 3 nights ago Name of Substance 3: adderall 3 - Age of First Use: 33 3 - Amount (size/oz): 1 pill 3 - Frequency: states that he used it on one occasion 3 - Duration: 1 time 3 - Last Use / Amount: 3 days ago  Sleep: Good  Appetite:  Good  Current Medications: Current Facility-Administered Medications  Medication Dose Route Frequency Provider Last Rate Last Admin  . acetaminophen (TYLENOL) tablet 650 mg  650 mg Oral Q6H PRN Armandina Stammer I, NP   650 mg at 09/15/19 0825  . alum & mag hydroxide-simeth (MAALOX/MYLANTA) 200-200-20 MG/5ML suspension 30 mL  30 mL Oral Q4H PRN Nwoko, Agnes I, NP      . hydrOXYzine  (ATARAX/VISTARIL) tablet 25 mg  25 mg Oral Q6H PRN Armandina Stammer I, NP   25 mg at 09/15/19 0825  . lisinopril (ZESTRIL) tablet 10 mg  10 mg Oral Daily Armandina Stammer I, NP   10 mg at 09/15/19 1610  . OLANZapine zydis (ZYPREXA) disintegrating tablet 10 mg  10 mg Oral Q8H PRN Colby Reels, Rockey Situ, MD   10 mg at 09/14/19 1223   And  . LORazepam (ATIVAN) tablet 1 mg  1 mg Oral PRN Chilton Sallade, Rockey Situ, MD       And  . ziprasidone (GEODON) injection 20 mg  20 mg Intramuscular PRN Hisham Provence, Rockey Situ, MD      .  magnesium hydroxide (MILK OF MAGNESIA) suspension 30 mL  30 mL Oral Daily PRN Nwoko, Agnes I, NP      . nicotine (NICODERM CQ - dosed in mg/24 hours) patch 21 mg  21 mg Transdermal Q0600 Armandina StammerNwoko, Agnes I, NP   21 mg at 09/15/19 0619  . QUEtiapine (SEROQUEL) tablet 100 mg  100 mg Oral QHS Nwoko, Agnes I, NP      . QUEtiapine (SEROQUEL) tablet 25 mg  25 mg Oral BH-q7a Kennetta Pavlovic, Rockey SituFernando A, MD   25 mg at 09/15/19 [id number]  . sertraline (ZOLOFT) tablet 50 mg  50 mg Oral Daily Raghad Lorenz, Rockey SituFernando A, MD   50 mg at 09/15/19 [id number]   Lab Results:  Results for orders placed or performed during the hospital encounter of 09/13/19 (from the past 48 hour(s))  Respiratory Panel by RT PCR (Flu A&B, Covid) - Nasopharyngeal Swab     Status: None   Collection Time: 09/13/19  4:02 PM   Specimen: Nasopharyngeal Swab  Result Value Ref Range   SARS Coronavirus 2 by RT PCR NEGATIVE NEGATIVE    Comment: (NOTE) SARS-CoV-2 target nucleic acids are NOT DETECTED. The SARS-CoV-2 RNA is generally detectable in upper respiratoy specimens during the acute phase of infection. The lowest concentration of SARS-CoV-2 viral copies this assay can detect is 131 copies/mL. A negative result does not preclude SARS-Cov-2 infection and should not be used as the sole basis for treatment or other patient management decisions. A negative result may occur with  improper specimen collection/handling, submission of specimen other than nasopharyngeal swab,  presence of viral mutation(s) within the areas targeted by this assay, and inadequate number of viral copies (<131 copies/mL). A negative result must be combined with clinical observations, patient history, and epidemiological information. The expected result is Negative. Fact Sheet for Patients:  https://www.moore.com/https://www.fda.gov/media/142436/download Fact Sheet for Healthcare Providers:  https://www.young.biz/https://www.fda.gov/media/142435/download This test is not yet ap proved or cleared by the [place] FDA and  has been authorized for detection and/or diagnosis of SARS-CoV-2 by FDA under an Emergency Use Authorization (EUA). This EUA will remain  in effect (meaning this test can be used) for the duration of the COVID-19 declaration under Section 564(b)(1) of the Act, 21 U.S.C. section 360bbb-3(b)(1), unless the authorization is terminated or revoked sooner.    Influenza A by PCR NEGATIVE NEGATIVE   Influenza B by PCR NEGATIVE NEGATIVE    Comment: (NOTE) The Xpert Xpress SARS-CoV-2/FLU/RSV assay is intended as an aid in  the diagnosis of influenza from Nasopharyngeal swab specimens and  should not be used as a sole basis for treatment. Nasal washings and  aspirates are unacceptable for Xpert Xpress SARS-CoV-2/FLU/RSV  testing. Fact Sheet for Patients: https://www.moore.com/https://www.fda.gov/media/142436/download Fact Sheet for Healthcare Providers: https://www.young.biz/https://www.fda.gov/media/142435/download This test is not yet approved or cleared by the [place] FDA and  has been authorized for detection and/or diagnosis of SARS-CoV-2 by  FDA under an Emergency Use Authorization (EUA). This EUA will remain  in effect (meaning this test can be used) for the duration of the  Covid-19 declaration under Section 564(b)(1) of the Act, 21  U.S.C. section 360bbb-3(b)(1), unless the authorization is  terminated or revoked. Performed at [facility], [address] [place], [place] [address]   Lipid panel     Status: None    Collection Time: 09/14/19  7:24 AM  Result Value Ref Range   Cholesterol 111 0 - 200 mg/dL   Triglycerides 56 <119<150 mg/dL   HDL 46 >14>40 mg/dL  Total CHOL/HDL Ratio 2.4 RATIO   VLDL 11 0 - 40 mg/dL   LDL Cholesterol 54 0 - 99 mg/dL    Comment:        Total Cholesterol/HDL:CHD Risk Coronary Heart Disease Risk Table                     Men   Women  1/2 Average Risk   3.4   3.3  Average Risk       5.0   4.4  2 X Average Risk   9.6   7.1  3 X Average Risk  23.4   11.0        Use the calculated Patient Ratio above and the CHD Risk Table to determine the patient's CHD Risk.        ATP III CLASSIFICATION (LDL):  <100     mg/dL   Optimal  100-129  mg/dL   Near or Above                    Optimal  130-159  mg/dL   Borderline  160-189  mg/dL   High  >190     mg/dL   Very High Performed at [place] [address], [place], [place] [address]   TSH     Status: None   Collection Time: 09/14/19  7:24 AM  Result Value Ref Range   TSH 0.366 0.350 - 4.500 uIU/mL    Comment: Performed by a 3rd Generation assay with a functional sensitivity of <=0.01 uIU/mL. Performed at [facility], [place] [address], [place], [place] [address]   Ethanol     Status: None   Collection Time: 09/14/19  7:24 AM  Result Value Ref Range   Alcohol, Ethyl (B) <10 <10 mg/dL    Comment: (NOTE) Lowest detectable limit for serum alcohol is 10 mg/dL. For medical purposes only. Performed at [facility], [place] [address], [place], [place] [address]   CBC     Status: Abnormal   Collection Time: 09/14/19  7:24 AM  Result Value Ref Range   WBC 8.6 4.0 - 10.5 K/uL   RBC 4.34 4.22 - 5.81 MIL/uL   Hemoglobin 12.9 (L) 13.0 - 17.0 g/dL   HCT 39.3 39.0 - 52.0 %   MCV 90.6 80.0 - 100.0 fL   MCH 29.7 26.0 - 34.0 pg   MCHC 32.8 30.0 - 36.0 g/dL   RDW 13.9 11.5 - 15.5 %   Platelets 245 150 - 400 K/uL   nRBC 0.0 0.0 - 0.2 %    Comment: Performed at [facility], [place] [address] [place], [place] [address]  Comprehensive metabolic panel     Status: Abnormal   Collection Time: 09/14/19  7:24 AM  Result Value Ref Range   Sodium 141 135 - 145 mmol/L   Potassium 3.4 (L) 3.5 - 5.1 mmol/L   Chloride 107 98 - 111 mmol/L   CO2 25 22 - 32 mmol/L   Glucose, Bld 97 70 - 99 mg/dL   BUN 7 6 - 20 mg/dL   Creatinine, Ser 0.73 0.61 - 1.24 mg/dL   Calcium 9.1 8.9 - 10.3 mg/dL   Total Protein 6.6 6.5 - 8.1 g/dL   Albumin 3.5 3.5 - 5.0 g/dL   AST 27 15 - 41 U/L   ALT 32 0 - 44 U/L   Alkaline Phosphatase 56 38 - 126 U/L   Total Bilirubin 1.0  0.3 - 1.2 mg/dL   GFR calc non Af Amer >60 >60 mL/min   GFR calc Af Amer >60 >60 mL/min   Anion gap 9 5 - 15    Comment: Performed at [facility], [address] [place], [place] [address]  Hemoglobin A1c     Status: None   Collection Time: 09/14/19  7:24 AM  Result Value Ref Range   Hgb A1c MFr Bld 5.5 4.8 - 5.6 %    Comment: (NOTE) Pre diabetes:          5.7%-6.4% Diabetes:              >6.4% Glycemic control for   <7.0% adults with diabetes    Mean Plasma Glucose 111.15 mg/dL    Comment: Performed at [facility], [address], [place], [place] [address]   Blood Alcohol level:  Lab Results  Component Value Date   ETH <10 09/14/2019   Metabolic Disorder Labs: Lab Results  Component Value Date   HGBA1C 5.5 09/14/2019   MPG 111.15 09/14/2019   No results found for: PROLACTIN Lab Results  Component Value Date   CHOL 111 09/14/2019   TRIG 56 09/14/2019   HDL 46 09/14/2019   CHOLHDL 2.4 09/14/2019   VLDL 11 09/14/2019   LDLCALC 54 09/14/2019   Physical Findings: AIMS:  , ,  ,  ,    CIWA:    COWS:     Musculoskeletal: Strength & Muscle Tone: within normal limits Gait & Station: normal Patient leans: N/A  Psychiatric Specialty Exam: Physical Exam  Nursing note and vitals reviewed. Constitutional: He is oriented to person, place, and time. He  appears well-developed.  Cardiovascular: Normal rate.  Respiratory: Effort normal.  Genitourinary:    Genitourinary Comments: Deferred   Musculoskeletal:        General: Normal range of motion.     Cervical back: Normal range of motion.  Neurological: He is alert and oriented to person, place, and time.  Skin: Skin is warm and dry.    Review of Systems  Constitutional: Negative for chills, diaphoresis and fever.  HENT: Negative for congestion, rhinorrhea, sneezing and sore throat.   Respiratory: Negative for cough, shortness of breath and wheezing.   Cardiovascular: Negative for chest pain and palpitations.  Gastrointestinal: Negative for diarrhea, nausea and vomiting.  Genitourinary: Negative for difficulty urinating.  Skin: Negative for color change.  Allergic/Immunologic: Negative for environmental allergies and food allergies.  Neurological: Negative for dizziness and headaches.  Psychiatric/Behavioral: Positive for dysphoric mood ("Improving") and hallucinations ("Improving"). Negative for agitation, behavioral problems, confusion, decreased concentration, self-injury, sleep disturbance and suicidal ideas. The patient is not nervous/anxious ("Improving") and is not hyperactive.     Blood pressure 125/88, pulse 88, temperature 98.3 F (36.8 C), resp. rate 20, SpO2 99 %.There is no height or weight on file to calculate BMI.  General Appearance: Fairly Groomed  Eye Contact:  Fair  Speech:  Normal Rate  Volume:  Normal  Mood: "Improving"  Affect: Reactive   Thought Process:  Linear and Descriptions of Associations: Intact  Orientation:  Full (Time, Place, and Person)  Thought Content: Denies nay hallucinations, delusions or paranoia.  Suicidal Thoughts:  No denies suicidal or self injurious ideations at this time, denies homicidal or violent ideations, contracts for safety. Reports, "I actually called & apologized to my brother for thinking that he was trying to kill me".    Homicidal Thoughts: Denies  Memory: Recent and remote  grossly intact   Judgement: Fair  Insight: Fair & improving.  Psychomotor Activity:  Normal- no current psychomotor agitation  Concentration:  Concentration: Good and Attention Span: Good  Recall:  Good  Fund of Knowledge:  Good  Language:  Good  Akathisia:  Negative  Handed:  Right  AIMS (if indicated):     Assets:  Communication Skills Desire for Improvement Resilience  ADL's:  Intact  Cognition:  WNL    Sleep:  Number of Hours: 6.25   Treatment Plan Summary: Daily contact with patient to assess and evaluate symptoms and progress in treatment and Medication management.  -Continue inpatient hospitalization.  -Will continue today 09/15/2019 plan as below except where it is noted.  -Mood control  -Continue Seroquel 100 mg po qhs.   -Continue Zoloft 50 mg po daily.             -Seroquel 25 mg po daily in am for agitation.  -Anxiety  -Continue atarax 25 mg po q6h prn.  -Insomnia  -Continue rozerem 8mg  po qhs  -Agitation/psychosis             -Continue Olanzapine Zydis 10 mg po Q 8 hrs prn.             &             -Lorazepam 1 mg po prn x 1 dose.               &    -Continue geodon 20mg  IM prn x 1 dose..             -HTN  -Continue lisinopril 10mg  po daily  -Encourage participation in groups and therapeutic milieu  -Disposition planning will be ongoing  , NP, PMHNP, FNP-BC 09/15/2019, 1:53 PM   Agree with NP Note

## 2019-09-15 NOTE — Progress Notes (Signed)
   09/15/19 2115  COVID-19 Daily Checkoff  Have you had a fever (temp > 37.80C/100F)  in the past 24 hours?  No  If you have had runny nose, nasal congestion, sneezing in the past 24 hours, has it worsened? No  COVID-19 EXPOSURE  Have you traveled outside the state in the past 14 days? No  Have you been in contact with someone with a confirmed diagnosis of COVID-19 or PUI in the past 14 days without wearing appropriate PPE? No  Have you been living in the same home as a person with confirmed diagnosis of COVID-19 or a PUI (household contact)? No  Have you been diagnosed with COVID-19? No

## 2019-09-15 NOTE — Plan of Care (Signed)
Nurse discussed anxiety, depression and coping skills with patient.  

## 2019-09-15 NOTE — Progress Notes (Signed)
Patient has been in his room resting. He was compliant with medication and given a pitcher of water before he returned to his room to rest. Safety maintained with 15 min checks.   09/15/19 2115  Psych Admission Type (Psych Patients Only)  Admission Status Voluntary  Psychosocial Assessment  Patient Complaints Worrying  Eye Contact Fair  Facial Expression Flat  Affect Sad  Speech Unremarkable  Interaction No initiation;Isolative  Motor Activity Other (Comment) (WNL)  Appearance/Hygiene Unremarkable  Behavior Characteristics Appropriate to situation  Mood Depressed  Thought Process  Coherency WDL  Content WDL  Delusions None reported or observed  Perception WDL  Hallucination None reported or observed  Judgment WDL  Confusion None  Danger to Self  Current suicidal ideation? Denies  Danger to Others  Danger to Others None reported or observed

## 2019-09-15 NOTE — Progress Notes (Signed)
D:  Patient's self inventory sheet, patient has fair sleep, sleep medication hepful.  Fair appetite, low energy level, poor concentration.  Rated depression 6, hopeless 3, anxiety 7.  Denied withdrawals.  Checked nausea.  Denied SI.  Physical pain, back, worst pain #6 in past 24 hours.  Goal is trying to fix me.  Plans to try to push the problems out of his mind.  No discharge plans. A:  Medications administered per MD orders.  Emotional support and encouragement given patient. R:  Safety maintained with 15 minute checks.  Denied SI and HI, contracts for safety.  Denied A/V hallucinations.

## 2019-09-16 NOTE — Progress Notes (Signed)
   09/16/19 1000  Psych Admission Type (Psych Patients Only)  Admission Status Voluntary  Psychosocial Assessment  Patient Complaints Worrying  Eye Contact Brief  Facial Expression Flat  Affect Depressed  Speech Unremarkable  Interaction Isolative  Motor Activity Other (Comment) (wnl)  Appearance/Hygiene Unremarkable  Behavior Characteristics Anxious  Mood Depressed;Anxious;Preoccupied  Thought Process  Coherency WDL  Content WDL  Delusions None reported or observed  Perception WDL  Hallucination None reported or observed  Judgment WDL  Confusion None  Danger to Self  Current suicidal ideation? Denies  Danger to Others  Danger to Others None reported or observed

## 2019-09-16 NOTE — Progress Notes (Signed)
Patient ID: Brad Pollard, male   DOB: 07-26-1986, 34 y.o.   MRN: [id number] Pt A&O x 4, resting at present, irritable when given evening meds.  No distress noted, cooperative, guarded.  Monitoring for safety.
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
## 2019-09-16 NOTE — [facility] Group Notes (Signed)
[facility] Group Notes: (Clinical Social Work)   09/16/2019      Type of Therapy:  Group Therapy   Participation Level:  Did Not Attend - was invited both individually by [facility] and by overhead announcement, chose not to attend.   Ambrose Mantle, LCSW 09/16/2019, 11:25 AM

## 2019-09-16 NOTE — Progress Notes (Signed)
   09/16/19 2348  COVID-19 Daily Checkoff  Have you had a fever (temp > 37.80C/100F)  in the past 24 hours?  No  If you have had runny nose, nasal congestion, sneezing in the past 24 hours, has it worsened? No  COVID-19 EXPOSURE  Have you traveled outside the state in the past 14 days? No  Have you been in contact with someone with a confirmed diagnosis of COVID-19 or PUI in the past 14 days without wearing appropriate PPE? No  Have you been living in the same home as a person with confirmed diagnosis of COVID-19 or a PUI (household contact)? No  Have you been diagnosed with COVID-19? No

## 2019-09-16 NOTE — Progress Notes (Signed)
[place] NOVEL CORONAVIRUS (COVID-19) DAILY CHECK-OFF SYMPTOMS - answer yes or no to each - every day NO YES  Have you had a fever in the past 24 hours?  . Fever (Temp > 37.80C / 100F) X   Have you had any of these symptoms in the past 24 hours? . New Cough .  Sore Throat  .  Shortness of Breath .  Difficulty Breathing .  Unexplained Body Aches   X   Have you had any one of these symptoms in the past 24 hours not related to allergies?   . Runny Nose .  Nasal Congestion .  Sneezing   X   If you have had runny nose, nasal congestion, sneezing in the past 24 hours, has it worsened?  X   EXPOSURES - check yes or no X   Have you traveled outside the state in the past 14 days?  X   Have you been in contact with someone with a confirmed diagnosis of COVID-19 or PUI in the past 14 days without wearing appropriate PPE?  X   Have you been living in the same home as a person with confirmed diagnosis of COVID-19 or a PUI (household contact)?    X   Have you been diagnosed with COVID-19?    X              What to do next: Answered NO to all: Answered YES to anything:   Proceed with unit schedule Follow the [facility] Inpatient Flowsheet.   

## 2019-09-17 MED ORDER — NICOTINE POLACRILEX 2 MG MT GUM: 2.0000 mg | CHEWING_GUM | OROMUCOSAL | Status: DC | PRN

## 2019-09-17 NOTE — Progress Notes (Signed)
D. Pt presented as mildly irritable upon initial approach this am- refused to get out of bed for am meds, stating, "I'll get up and take them later". Pt remained in bed for the remainder of the am.  .  A. Labs and vitals monitored R. Pt remains safe with 15 minute checks. Will continue POC.
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
## 2019-09-17 NOTE — [facility] Group Notes (Signed)
[facility] Group Notes: (Clinical Social Work)   09/17/2019      Type of Therapy:  Group Therapy   Participation Level:  Did Not Attend - was invited both individually by [facility] and by overhead announcement, chose not to attend.   Ambrose Mantle, LCSW 09/17/2019, 12:25 PM

## 2019-09-18 MED ORDER — HYDROXYZINE HCL 25 MG PO TABS: 25.0000 mg | ORAL_TABLET | Freq: Four times a day (QID) | ORAL | 0 refills | Status: AC | PRN

## 2019-09-18 MED ORDER — SERTRALINE HCL 50 MG PO TABS: 50.0000 mg | ORAL_TABLET | Freq: Every day | ORAL | 0 refills | Status: DC

## 2019-09-18 MED ORDER — QUETIAPINE FUMARATE 25 MG PO TABS: 25.0000 mg | ORAL_TABLET | ORAL | 0 refills | Status: DC

## 2019-09-18 MED ORDER — LISINOPRIL 10 MG PO TABS: 10.0000 mg | ORAL_TABLET | Freq: Every day | ORAL | 0 refills | Status: AC

## 2019-09-18 MED ORDER — NICOTINE 21 MG/24HR TD PT24: 21.0000 mg | MEDICATED_PATCH | Freq: Every day | TRANSDERMAL | 0 refills | Status: AC

## 2019-09-18 MED ORDER — QUETIAPINE FUMARATE 100 MG PO TABS: 100.0000 mg | ORAL_TABLET | Freq: Every day | ORAL | 0 refills | Status: DC

## 2019-09-18 NOTE — Progress Notes (Signed)
Recreation Therapy Notes  Date:  1.25.21 Time: 0930 Location: [address] Group Room  Group Topic: Stress Management  Goal Area(s) Addresses:  Patient will identify positive stress management techniques. Patient will identify benefits of using stress management post d/c.  Intervention: Stress Management  Activity :  Meditation.  LRT played a meditation that focused on the making most of your day and exploring the possibilities each moment holds.  Patients were to listen and follow along as meditation played to engage in group.   Education:  Stress Management, Discharge Planning.   Education Outcome: Acknowledges Education  Clinical Observations/Feedback: Pt did not attend group activity.    Caroll Rancher, LRT/CTRS         Caroll Rancher A 09/18/2019 10:51 AM

## 2019-09-18 NOTE — Progress Notes (Signed)
Discharge Note:  Patient discharged home.  Patient denied SI and HI.  Denied A/V hallucinations.   Suicide prevention information given to patient who stated he understood and had no questions.  Patient stated he received all his belongings, clothing, etc.  Patient stated he appreciated all assistance received from [facility] staff.

## 2019-09-18 NOTE — Progress Notes (Signed)
Patient observed in bed sleeping. No distress noted. Respirations even and non labored. Monitoring continues.
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
## 2019-09-18 NOTE — BHH Group Notes (Signed)
LCSW Group Therapy Note 09/18/2019 1:57 PM  Type of Therapy and Topic: Group Therapy: Overcoming Obstacles  Participation Level: Active  Description of Group:  In this group patients will be encouraged to explore what they see as obstacles to their own wellness and recovery. They will be guided to discuss their thoughts, feelings, and behaviors related to these obstacles. The group will process together ways to cope with barriers, with attention given to specific choices patients can make. Each patient will be challenged to identify changes they are motivated to make in order to overcome their obstacles. This group will be process-oriented, with patients participating in exploration of their own experiences as well as giving and receiving support and challenge from other group members.  Therapeutic Goals: 1. Patient will identify personal and current obstacles as they relate to admission. 2. Patient will identify barriers that currently interfere with their wellness or overcoming obstacles.  3. Patient will identify feelings, thought process and behaviors related to these barriers. 4. Patient will identify two changes they are willing to make to overcome these obstacles:   Summary of Patient Progress  Brad Pollard reports he does not have any current obstacles at this time.    Therapeutic Modalities:  Cognitive Behavioral Therapy Solution Focused Therapy Motivational Interviewing Relapse Prevention Therapy   Brad Pollard Clinical Social Worker

## 2019-09-18 NOTE — Progress Notes (Signed)
D:  Patient's self inventory sheet, patient sleeps good, no sleep medication.  Fair appetite, low energy level, good concentration.  Rated depression, anxiety, hopeless #4.  Denied withdrawals.  Denied SI.  Denied physical problems.  Denied physical pain.  Goal is discharge.   A:  Medications administered per MD orders.  Emotional support and encouragement given patient. R:  Denied SI and HI, contracts for safety.  Denied A/V hallucinations.  Safety maintained with 15 minute checks.

## 2019-09-18 NOTE — Progress Notes (Signed)
  [facility] Adult Case Management Discharge Plan :  Will you be returning to the same living situation after discharge:  Yes,  [facility] At discharge, do you have transportation home?: Yes,  a [facility] will pick the patient up Do you have the ability to pay for your medications: No.  Release of information consent forms completed and in the chart;  Patient's signature needed at discharge.  Patient to Follow up at: Follow-up Information     [facility]. Go on 10/02/2019.   Why: You are scheduled for an appointment on 10/02/19 at 9:30 am with Dr Laural Benes to establish care and for Hepatitis C follow-up.  Please have your insurance information and your discharge summary from this hospitalization available.  Contact information: [address] [place] [address] [phone]       [facility] Follow up on 09/25/2019.   Why: You are scheduled for an appointment on  09/25/19 at 11:00 am.  This appointment will be held via telephone.  Please have your insurance information and your discharge summary from this hospitalization available.  Contact information: [address] [place] [address] [phone]           Next level of care provider has access to [facility] Link:yes  Safety Planning and Suicide Prevention discussed: Yes,  with the patient     Has patient been referred to the Quitline?: Patient refused referral  Patient has been referred for addiction treatment: Pt. refused referral  Maeola Sarah, LCSWA 09/18/2019, 11:19 AM
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
## 2019-10-02 ENCOUNTER — Ambulatory Visit: Payer: Self-pay | Attending: Internal Medicine | Admitting: Internal Medicine

## 2019-10-02 ENCOUNTER — Other Ambulatory Visit: Payer: Self-pay

## 2019-10-11 ENCOUNTER — Emergency Department (HOSPITAL_COMMUNITY)

## 2019-10-11 ENCOUNTER — Other Ambulatory Visit: Payer: Self-pay

## 2019-10-11 ENCOUNTER — Emergency Department (HOSPITAL_COMMUNITY)
Admission: EM | Admit: 2019-10-11 | Discharge: 2019-10-12 | Disposition: A | Discharge status: Home / Self Care | Attending: Emergency Medicine | Admitting: Emergency Medicine

## 2019-10-11 ENCOUNTER — Encounter (HOSPITAL_COMMUNITY): Payer: Self-pay

## 2019-10-11 DIAGNOSIS — I10 Essential (primary) hypertension: Secondary | ICD-10-CM | POA: Insufficient documentation

## 2019-10-11 DIAGNOSIS — F22 Delusional disorders: Secondary | ICD-10-CM | POA: Insufficient documentation

## 2019-10-11 DIAGNOSIS — F329 Major depressive disorder, single episode, unspecified: Secondary | ICD-10-CM | POA: Insufficient documentation

## 2019-10-11 DIAGNOSIS — F15259 Other stimulant dependence with stimulant-induced psychotic disorder, unspecified: Secondary | ICD-10-CM | POA: Insufficient documentation

## 2019-10-11 DIAGNOSIS — R079 Chest pain, unspecified: Secondary | ICD-10-CM

## 2019-10-11 DIAGNOSIS — F17210 Nicotine dependence, cigarettes, uncomplicated: Secondary | ICD-10-CM | POA: Diagnosis not present

## 2019-10-11 DIAGNOSIS — R0789 Other chest pain: Secondary | ICD-10-CM | POA: Insufficient documentation

## 2019-10-11 DIAGNOSIS — Z79899 Other long term (current) drug therapy: Secondary | ICD-10-CM | POA: Insufficient documentation

## 2019-10-11 DIAGNOSIS — F19959 Other psychoactive substance use, unspecified with psychoactive substance-induced psychotic disorder, unspecified: Secondary | ICD-10-CM

## 2019-10-11 HISTORY — DX: Unspecified viral hepatitis C without hepatic coma: B19.20

## 2019-10-11 HISTORY — DX: Essential (primary) hypertension: I10

## 2019-10-11 LAB — CBC
HCT: 44.1 % (ref 39.0–52.0)
Hemoglobin: 14.7 g/dL (ref 13.0–17.0)
MCH: 30.6 pg (ref 26.0–34.0)
MCHC: 33.3 g/dL (ref 30.0–36.0)
MCV: 91.9 fL (ref 80.0–100.0)
Platelets: 260 10*3/uL (ref 150–400)
RBC: 4.8 MIL/uL (ref 4.22–5.81)
RDW: 13.8 % (ref 11.5–15.5)
WBC: 8.2 10*3/uL (ref 4.0–10.5)
nRBC: 0 % (ref 0.0–0.2)

## 2019-10-11 LAB — COMPREHENSIVE METABOLIC PANEL
ALT: 28 U/L (ref 0–44)
AST: 29 U/L (ref 15–41)
Albumin: 4.1 g/dL (ref 3.5–5.0)
Alkaline Phosphatase: 64 U/L (ref 38–126)
Anion gap: 10 (ref 5–15)
BUN: 8 mg/dL (ref 6–20)
CO2: 22 mmol/L (ref 22–32)
Calcium: 9.3 mg/dL (ref 8.9–10.3)
Chloride: 103 mmol/L (ref 98–111)
Creatinine, Ser: 0.88 mg/dL (ref 0.61–1.24)
GFR calc Af Amer: >60 mL/min (ref >60)
GFR calc non Af Amer: >60 mL/min (ref >60)
Glucose, Bld: 113 mg/dL — ABNORMAL HIGH (ref 70–99)
Potassium: 3.2 mmol/L — ABNORMAL LOW (ref 3.5–5.1)
Sodium: 135 mmol/L (ref 135–145)
Total Bilirubin: 1.1 mg/dL (ref 0.3–1.2)
Total Protein: 7.6 g/dL (ref 6.5–8.1)

## 2019-10-11 LAB — SALICYLATE LEVEL: Salicylate Lvl: <7 mg/dL — ABNORMAL LOW (ref 7.0–30.0)

## 2019-10-11 LAB — ACETAMINOPHEN LEVEL: Acetaminophen (Tylenol), Serum: <10 ug/mL — ABNORMAL LOW (ref 10–30)

## 2019-10-11 LAB — ETHANOL: Alcohol, Ethyl (B): <10 mg/dL (ref <10)

## 2019-10-11 MED ORDER — SODIUM CHLORIDE 0.9% FLUSH: 3.0000 mL | Freq: Once | INTRAVENOUS | Status: DC

## 2019-10-11 NOTE — ED Triage Notes (Signed)
Patient arrived via [facility] with complaints of chest tightness and dizziness in the last 3-4 hours. Reports being up for the last three days after stimulant use and has not had his Seroquel in two days. Reporting visual hallucinations and paranoia. Denies SI or HI.

## 2019-10-11 NOTE — ED Provider Notes (Signed)
[place] DEPT Provider Note   CSN: [id number] Arrival date & time: 10/11/19  2302     History Chief Complaint  Patient presents with  . Medical Clearance  . Chest Pain    Mali Bartle is a 34 y.o. male.  The history is provided by the patient and medical records.  Chest Pain   34 year old male with history of hepatitis C, hypertension, depression, presenting to the ED for multiple complaints.  Patient reports he was released from jail on 08/29/2019 and since that time he has been in a downward spiral.  Reports he feels like his entire world is turned against him, he has no family or other support system.  States "I have lost everything".  He states he had to go back to the halfway house today and felt like everyone there was trying to kill him.  States he feels very paranoid and unsettled.  He was awake for about 48 hours but states he did sleep about 8 hours last night.  He has not been eating/drinking.  Also reports he started having chest pain.  States he thinks it may be anxiety.  No SOB, palpitations, diaphoresis, nausea, or vomiting.  No cardiac history.  Past Medical History:  Diagnosis Date  . Hepatitis C   . Hypertension     Patient Active Problem List   Diagnosis Date Noted  . Major depressive disorder, single episode, severe with psychotic features ([place]) 09/14/2019  . Severe recurrent major depressive disorder with psychotic features ([place]) 09/14/2019  . Acute psychosis ([place]) 09/13/2019    History reviewed. No pertinent surgical history.     No family history on file.  Social History   Tobacco Use  . Smoking status: Current Every Day Smoker    Packs/day: 1.00  . Smokeless tobacco: Never Used  Substance Use Topics  . Alcohol use: Yes    Comment: fews beers on occasion  . 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 Authorizing Provider  hydrOXYzine (ATARAX/VISTARIL) 25 MG tablet Take 1 tablet (25 mg total) by mouth every 6 (six) hours as needed for anxiety. 09/18/19   Connye Burkitt, NP  lisinopril (ZESTRIL) 10 MG tablet Take 1 tablet (10 mg total) by mouth daily. 09/19/19   Connye Burkitt, NP  nicotine (NICODERM CQ - DOSED IN MG/24 HOURS) 21 mg/24hr patch Place 1 patch (21 mg total) onto the skin daily at 6 (six) AM. 09/19/19   Connye Burkitt, NP  QUEtiapine (SEROQUEL) 100 MG tablet Take 1 tablet (100 mg total) by mouth at bedtime. 09/18/19   Connye Burkitt, NP  QUEtiapine (SEROQUEL) 25 MG tablet Take 1 tablet (25 mg total) by mouth every morning. 09/19/19   Connye Burkitt, NP  sertraline (ZOLOFT) 50 MG tablet Take 1 tablet (50 mg total) by mouth daily. 09/19/19   Connye Burkitt, NP    Allergies    Patient has no known allergies.  Review of Systems   Review of Systems  Cardiovascular: Positive for chest pain.  Psychiatric/Behavioral: Positive for agitation.  All other systems reviewed and are negative.   Physical Exam Updated Vital Signs BP (!) 143/89 (BP Location: Left Arm)   Pulse 85   Temp 98.7 F (37.1 C) (Oral)   Resp 16  Ht 6\' 3"  (1.905 m)   Wt 99.8 kg   SpO2 99%   BMI 27.50 kg/m   Physical Exam Vitals and nursing note reviewed.  Constitutional:      Appearance: He is well-developed.  HENT:     Head: Normocephalic and atraumatic.  Eyes:     Conjunctiva/sclera: Conjunctivae normal.     Pupils: Pupils are equal, round, and reactive to light.     Comments: Pupils dilated but reactive bilaterally  Cardiovascular:     Rate and Rhythm: Normal rate and regular rhythm.     Heart sounds: Normal heart sounds.  Pulmonary:     Effort: Pulmonary effort is normal.     Breath sounds: Normal breath sounds. No decreased breath sounds or wheezing.  Abdominal:     General: Bowel sounds are normal.     Palpations: Abdomen is soft.    Musculoskeletal:        General: Normal range of motion.     Cervical back: Normal range of motion.  Skin:    General: Skin is warm and dry.  Neurological:     Mental Status: He is alert and oriented to person, place, and time.  Psychiatric:     Comments: Fidgeting, appears anxious and paranoid during exam Denies SI/HI     ED Results / Procedures / Treatments   Labs (all labs ordered are listed, but only abnormal results are displayed) Labs Reviewed  SALICYLATE LEVEL - Abnormal; Notable for the following components:      Result Value   Salicylate Lvl <7.0 (*)    All other components within normal limits  ACETAMINOPHEN LEVEL - Abnormal; Notable for the following components:   Acetaminophen (Tylenol), Serum <10 (*)    All other components within normal limits  RAPID URINE DRUG SCREEN, HOSP PERFORMED - Abnormal; Notable for the following components:   Amphetamines POSITIVE (*)    All other components within normal limits  COMPREHENSIVE METABOLIC PANEL - Abnormal; Notable for the following components:   Potassium 3.2 (*)    Glucose, Bld 113 (*)    All other components within normal limits  CBC  ETHANOL  TROPONIN I (HIGH SENSITIVITY)    EKG None  Radiology DG Chest 2 View  Result Date: 10/11/2019 CLINICAL DATA:  Chest pain EXAM: CHEST - 2 VIEW COMPARISON:  None. FINDINGS: The heart size and mediastinal contours are within normal limits. Both lungs are clear. The visualized skeletal structures are unremarkable. IMPRESSION: No active cardiopulmonary disease. Electronically Signed   By: 10/13/2019 M.D.   On: 10/11/2019 23:34    Procedures Procedures (including critical care time)  Medications Ordered in ED Medications  sodium chloride flush (NS) 0.9 % injection 3 mL (3 mLs Intravenous Not Given 10/12/19 0058)    ED Course  I have reviewed the triage vital signs and the nursing notes.  Pertinent labs & imaging results that were available during my care of the  patient were reviewed by me and considered in my medical decision making (see chart for details).    MDM Rules/Calculators/A&P  34 year old male here with multiple complaints.  Has been feeling like he is going into a downward spiral since being released from jail in January.  Has started to feel paranoid and like everyone is out to get him.  He denies any suicidal or homicidal ideation.  Reported visual hallucinations to triage, however did not mention this to me.  He seems to have insight into his issues, does not appear acutely  psychotic.  Also reports chest pain started today.  No prior cardiac history.  EKG is nonischemic.  Labs are reassuring, troponin negative.  Symptoms not concerning for ACS, PE, dissection, or other acute cardiac event.  Patient medically cleared.  Awaiting TTS evaluation.  Care will be signed out to oncoming provider pending TTS evaluation.  Final Clinical Impression(s) / ED Diagnoses Final diagnoses:  Chest pain in adult  Paranoia [facility] [place])    Rx / DC Orders ED Discharge Orders    None       Garlon Hatchet, PA-C 10/12/19 2094    Nicanor Alcon, April, MD 10/12/19 [phone]

## 2019-10-12 DIAGNOSIS — F19959 Other psychoactive substance use, unspecified with psychoactive substance-induced psychotic disorder, unspecified: Secondary | ICD-10-CM

## 2019-10-12 LAB — TROPONIN I (HIGH SENSITIVITY): Troponin I (High Sensitivity): 7 ng/L (ref <18)

## 2019-10-12 LAB — RAPID URINE DRUG SCREEN, HOSP PERFORMED
Amphetamines: POSITIVE — AB
Barbiturates: NOT DETECTED
Benzodiazepines: NOT DETECTED
Cocaine: NOT DETECTED
Opiates: NOT DETECTED
Tetrahydrocannabinol: NOT DETECTED

## 2019-10-12 NOTE — ED Notes (Signed)
Patient reports he has a ride and waiting for them to pick him up.

## 2019-10-12 NOTE — ED Notes (Signed)
BH consult in progress  

## 2019-10-12 NOTE — ED Notes (Signed)
Per [facility] note. Patient to be discharged after seeing peer support.

## 2019-10-12 NOTE — ED Notes (Signed)
Patient given lunch meal tray

## 2019-10-12 NOTE — ED Notes (Signed)
Pt speaking with telepsych at this time. Remains calm and cooperative. NAD. No complaints voiced.

## 2019-10-12 NOTE — ED Notes (Signed)
Spoke to peer support at [phone].   Peer support has met with patient already and are creating there note. Patient given resources for behavioral health outpatient.

## 2019-10-12 NOTE — ED Notes (Signed)
Belongings given back to patient. 3 bags. Patient reports his ride is here.

## 2019-10-12 NOTE — [facility] Suicide Risk Assessment (Cosign Needed)
Suicide Risk Assessment  Discharge Assessment   [facility] Discharge Suicide Risk Assessment   Principal Problem: Psychoactive substance-induced psychosis (HCC) Discharge Diagnoses: Principal Problem:   Psychoactive substance-induced psychosis (HCC)   Total Time spent with patient: 30 minutes  Musculoskeletal: Strength & Muscle Tone: within normal limits Gait & Station: normal Patient leans: N/A  Psychiatric Specialty Exam:   Blood pressure (!) 142/75, pulse 92, temperature 98.2 F (36.8 C), temperature source Oral, resp. rate 18, height 6\' 3"  (1.905 m), weight 99.8 kg, SpO2 100 %.Body mass index is 27.5 kg/m.  General Appearance: Casual and patient has multiple tattoos to face and upper extremities  Eye Contact::  Good  Speech:  Clear and Coherent  Volume:  Normal  Mood:  Euthymic  Affect:  Congruent  Thought Process:  Coherent and Descriptions of Associations: Circumstantial  Orientation:  Full (Time, Place, and Person)  Thought Content:  Logical  Suicidal Thoughts:  No  Homicidal Thoughts:  No  Memory:  Immediate;   Good Recent;   Good Remote;   Good  Judgement:  Intact  Insight:  Fair  Psychomotor Activity:  Normal  Concentration:  Good  Recall:  Good  Fund of Knowledge:Fair  Language: Good  Akathisia:  Negative  Handed:  Right  AIMS (if indicated):     Assets:  Communication Skills Resilience  Sleep:     Cognition: WNL  ADL's:  Intact   Mental Status Per Nursing Assessment::   On Admission:     Demographic Factors:  Male and Low socioeconomic status  Loss Factors: NA  Historical Factors: Family history of mental illness or substance abuse  Risk Reduction Factors:   Positive social support  Continued Clinical Symptoms:  Depression:   Severe  Cognitive Features That Contribute To Risk:  Closed-mindedness    Suicide Risk:  Mild:  Suicidal ideation of limited frequency, intensity, duration, and specificity.  There are no identifiable plans, no  associated intent, mild dysphoria and related symptoms, good self-control (both objective and subjective assessment), few other risk factors, and identifiable protective factors, including available and accessible social support.  Follow-up Information    [phone], MD In 1 week.   Specialty: Internal Medicine Contact information: [address] [place] [phone]        [facility]-EMERGENCY DEPT.   Specialty: Emergency Medicine Why: As needed Contact information: [address] [phone] [address] [place] [phone]          Plan Of Care/Follow-up recommendations:  Patient evaluated for acute psychosis in the setting of poly substance usage.  UDS + for amphetamines.  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 His presentation is suspicious for secondary gain in that he cites sibling rivalry concerns that limit him from living with this brother; and is not excited about returning to the substance treatment facility where he currently resides.  The patient is known to our facility for similar presentation.  He was recently discharged from [facility] Sep 13, 2019 with recommendations to follow-up with his outpatient mental health resources, of which he did not. Today he denies suicidal or homicidal ideations; denies audible or visual hallucinations.  He does not demonstrate paranoid or delusional behaviors. He does admit "the adderall" made him confused but states he feels better today and is at baseline.  He does continue to endorse chronic psychosocial concerns contributing to frustration but again, no overt safety concerns verbalized today.    He  does not meet criteria  for psychiatric hospitalization at this time.  Peer support counseling ordered for evaluation.  He is psych cleared for discharge with recommendations to [facility] with [facility].  This has been included in pt's discharge  instructions.   Activity:  as tolerated Diet:  as tolerated Tests:  as tolerated   Plan reviewed with Dr. Stark Jock, EDP.  Mallie Darting, NP 10/12/2019, 1:29 PM

## 2019-10-12 NOTE — BH Assessment (Signed)
[facility] Assessment Progress Note  Per Nelly Rout, MD, this pt does not require psychiatric hospitalization at this time.  Pt is to be discharged from [facility] with recommendation to continue treatment with [facility].  This has been included in pt's discharge instructions.  Pt would also benefit from seeing Peer Support Specialists, and a peer support consult has been ordered for pt.  Pt's nurse has been notified.  Doylene Canning, MA Triage Specialist [phone]

## 2019-10-12 NOTE — ED Notes (Signed)
Pt belongings gathered and locked up. Pt wearing scrubs. Refused cardiac monitor

## 2019-10-12 NOTE — Patient Outreach (Signed)
ED Peer Support Specialist Patient Intake (Complete at intake & 30-60 Day Follow-up)  Name: Brad Pollard  MRN: [id number]  Age: 34 y.o.   Date of Admission: 10/12/2019  Intake: Initial Comments:      Primary Reason Admitted: Paranoia (HCC)   Lab values: Alcohol/ETOH: Negative Positive UDS? Yes Amphetamines: Yes Barbiturates: No Benzodiazepines: No Cocaine: No Opiates: No Cannabinoids: No  Demographic information: Gender: Male Ethnicity: White Marital Status: Widowed Insurance Status: Medicaid Receives non-medical governmental assistance ([employer]/Welfare, food stamps, etc.: Yes(Food Stamps) Lives with: Alone Living situation: House/Apartment  Reported Patient History: Patient reported health conditions: Anxiety disorders, Bipolar disorder, Depression Patient aware of HIV and hepatitis status: Yes (comment)(Hep C)  In past year, has patient visited ED for any reason? No  Number of ED visits:    Reason(s) for visit:    In past year, has patient been hospitalized for any reason? No  Number of hospitalizations:    Reason(s) for hospitalization:    In past year, has patient been arrested? No  Number of arrests:    Reason(s) for arrest:    In past year, has patient been incarcerated? No  Number of incarcerations:    Reason(s) for incarceration:    In past year, has patient received medication-assisted treatment? No  In past year, patient received the following treatments:    In past year, has patient received any harm reduction services?    Did this include any of the following?    In past year, has patient received care from a mental health provider for diagnosis other than SUD? No  In past year, is this first time patient has overdosed? No  Number of past overdoses:    In past year, is this first time patient has been hospitalized for an overdose? No  Number of hospitalizations for overdose(s):    Is patient currently receiving treatment for a mental health  diagnosis? No  Patient reports experiencing difficulty participating in SUD treatment: No    Most important reason(s) for this difficulty?    Has patient received prior services for treatment? No  In past, patient has received services from following agencies:    Plan of Care:  Suggested follow up at these agencies/treatment centers: Other (comment)  Other information: CPSS met with Pt an was able to complete series of question to better assist Pt. CPSS was made aware that Pt was admitted by the half way house staff because he was not in a normal state of being. Pt addressed the fact that he has been trying to better the quality of his life, by ways of taking the medications needed. Pt addressed the fact that he has not been taking his medications an that he feels that he needs to be admitted to [facility] for additional services. CPSS made Pt issued Pt resource list of outpatient treatment facility for duel diagnosis facilities. CPSS left contact information for Pt for community support if needed.       , CPSS  10/12/2019 11:44 AM          

## 2019-10-12 NOTE — Discharge Instructions (Addendum)
Continue medications as previously prescribed.  For your behavioral health needs, you are advised to continue treatment with [facility]:       [facility]      [address], [place] [address]      [phone]

## 2019-10-12 NOTE — BH Assessment (Addendum)
Tele Assessment Note   Patient Name: Brad Pollard MRN: [id number] Referring Physician: Dr. Cy Blamer, MD Location of Patient: [facility] Location of Provider: Behavioral Health TTS Department  Brad Gille is a 33 y.o. male who was brought to [facility] via EMS with complaints of chest tightness and dizziness for the past 3-4 hours. Pt reported to his triage nurse that he had been up for the last 3 days after stimulant use and had not had his Seroquel in two days; he was reporting VH and paranoia. Pt reported to clinician that he had been experiencing high stress, anxiety, seeing things, thinking people are after him, and seeing the walls moving and the floors rolling. Clinician inquired about SA and pt denied this, stating he had taken an Adderall XR 30mg  and allowed it to dissolve in his mouth "a few days ago" but that he otherwise has not used any substances. Pt's UDA was positive for Amphetamine.  Pt denies SI, a hx of SI, any attempts to kill himself, a plan to kill himself, and states he was accepted at [facility] several weeks ago for one week. According to pt's chart, he was accepted at [facility] from September 13, 2019 - September 18, 2019 and d/c instructions included for him to f/u with [facility] on 09/25/2019 and with [facility] on 10/02/2019.  Pt denies HI, NSSIB, access to guns/weapons, or engagement with the legal system. Pt denies that he has, or has ever had, a therapist or a psychiatrist, though he states he's currently prescribed Seroquel 25mg  AM and 100mg  PM and Zoloft (he cannot remember the mg) 1x/day.   Pt's protective factors include the ability for him to stay in a halfway house, his support from his mother, his love for his children, and his desire to get better.  Pt provided verbal consent to contact his mother, Brad Pollard, for collateral. Her contact information can be found in a separate note.  Pt was oriented x4. His recent and remote memory was  intact. Pt was cooperative, though drowsy, throughout the assessment process. Pt's insight and judgement are fair at this time; his impulse control is poor.   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 Hepatitis C   . Hypertension     History reviewed. No pertinent surgical history.  Family History: No family history on file.  Social History:  reports that he has been smoking. He has been smoking about 1.00 pack per day. He has never used smokeless tobacco. He reports current alcohol use. He reports current drug use. Drugs: Amphetamines and Heroin.  Additional Social History:  Alcohol / Drug Use Pain Medications: Please see MAR Prescriptions: Please see MAR Over the Counter: Please see MAR History of alcohol / drug use?: Yes Longest period of sobriety (when/how long): Unknown Substance #1 Name of Substance 1: Methamphetamine 1 - Age of First Use: Unknown 1 - Amount (size/oz): Unknown 1 - Frequency: Unknown 1 - Duration: Unknown 1 - Last Use / Amount: Unknown Substance #2 Name of Substance 2: Adderall 2 - Age of First Use: Unknown 2 - Amount (size/oz): Unknown 2 - Frequency: Unknown 2 - Duration: Unknown 2 - Last Use / Amount: 1 30mg  XR pill that pt desolved in his mouth "a few days ago"  CIWA: CIWA-Ar BP: 122/78 Pulse Rate: 91 COWS:    Allergies: No Known Allergies  Home Medications: (Not  in a hospital admission)   OB/GYN Status:  No LMP for male patient.  General Assessment Data Location of Assessment: [facility] ED TTS Assessment: In system Is this a Tele or Face-to-Face Assessment?: Tele Assessment Is this an Initial Assessment or a Re-assessment for this encounter?: Initial Assessment Patient Accompanied by:: N/A Language Other than English: No Living Arrangements: Other (Comment)([facility], halfway house for released inmates) What gender do you  identify as?: Male Marital status: Widowed Living Arrangements: Other (Comment)([facility] - halfway house for released inmates) Can pt return to current living arrangement?: (Unknown) Admission Status: Voluntary Is patient capable of signing voluntary admission?: Yes Referral Source: Self/Family/Friend Insurance type: None     Crisis Care Plan Living Arrangements: Other (Comment)([facility] - halfway house for released inmates) Legal Guardian: Other:(Self) Name of Psychiatrist: Pt states "none"; chart states [facility] Name of Therapist: None  Education Status Is patient currently in school?: No Is the patient employed, unemployed or receiving disability?: Unemployed  Risk to self with the past 6 months Suicidal Ideation: No Has patient been a risk to self within the past 6 months prior to admission? : No Suicidal Intent: No Has patient had any suicidal intent within the past 6 months prior to admission? : No Is patient at risk for suicide?: No Suicidal Plan?: No Has patient had any suicidal plan within the past 6 months prior to admission? : No Access to Means: No What has been your use of drugs/alcohol within the last 12 months?: Pt denies; pt's UDA was positive for amphetamine Previous Attempts/Gestures: No How many times?: 0 Other Self Harm Risks: Pt has been experiencing psychosis Triggers for Past Attempts: None known Intentional Self Injurious Behavior: (Pt has been engaging in SA) Family Suicide History: No Recent stressful life event(s): Loss (Comment)(Pt's wife killed herself on 07/08/2018) Persecutory voices/beliefs?: No Depression: Yes Depression Symptoms: Despondent, Fatigue, Guilt, Loss of interest in usual pleasures, Feeling worthless/self pity, Feeling angry/irritable Substance abuse history and/or treatment for substance abuse?: Yes Suicide prevention information given to non-admitted patients: Not applicable  Risk to Others within the past 6  months Homicidal Ideation: No Does patient have any lifetime risk of violence toward others beyond the six months prior to admission? : No Thoughts of Harm to Others: No Current Homicidal Intent: No Current Homicidal Plan: No Access to Homicidal Means: No Identified Victim: None noted History of harm to others?: No Assessment of Violence: None Noted Violent Behavior Description: None noted Does patient have access to weapons?: No(Pt denies access to guns/weapons) Criminal Charges Pending?: No Does patient have a court date: No Is patient on probation?: No  Psychosis Hallucinations: Auditory, Visual Delusions: None noted  Mental Status Report Appearance/Hygiene: Disheveled, In scrubs Eye Contact: Fair Motor Activity: Agitation Speech: Slurred, Logical/coherent Level of Consciousness: Quiet/awake, Drowsy Mood: Anxious, Worthless, low self-esteem Affect: Appropriate to circumstance Anxiety Level: Minimal Thought Processes: Circumstantial Judgement: Impaired Orientation: Person, Place, Time, Situation Obsessive Compulsive Thoughts/Behaviors: Minimal  Cognitive Functioning Concentration: Fair Memory: Unable to Assess Is patient IDD: No Insight: Fair Impulse Control: Poor Appetite: Poor Have you had any weight changes? : No Change Sleep: Decreased Total Hours of Sleep: (No sleep in several days) Vegetative Symptoms: None  ADLScreening [facility] Assessment Services) Patient's cognitive ability adequate to safely complete daily activities?: Yes Patient able to express need for assistance with ADLs?: Yes Independently performs ADLs?: Yes (appropriate for developmental age)  Prior Inpatient Therapy Prior Inpatient Therapy: Yes Prior Therapy Dates: 09/12/2019 - 09/13/2019 Prior Therapy Facilty/Provider(s): [facility] Reason for Treatment: SA  Prior Outpatient Therapy Prior Outpatient Therapy: (Unknown)  ADL Screening (condition at time of admission) Patient's cognitive  ability adequate to safely complete daily activities?: Yes Is the patient deaf or have difficulty hearing?: No Does the patient have difficulty seeing, even when wearing glasses/contacts?: No Does the patient have difficulty concentrating, remembering, or making decisions?: Yes Patient able to express need for assistance with ADLs?: Yes Does the patient have difficulty dressing or bathing?: No Independently performs ADLs?: Yes (appropriate for developmental age) Does the patient have difficulty walking or climbing stairs?: No Weakness of Legs: None Weakness of Arms/Hands: None  Home Assistive Devices/Equipment Home Assistive Devices/Equipment: None  Therapy Consults (therapy consults require a physician order) PT Evaluation Needed: No OT Evalulation Needed: No SLP Evaluation Needed: No Abuse/Neglect Assessment (Assessment to be complete while patient is alone) Abuse/Neglect Assessment Can Be Completed: Yes Physical Abuse: Denies Verbal Abuse: Denies Sexual Abuse: Denies Exploitation of patient/patient's resources: Denies Self-Neglect: Denies Values / Beliefs Cultural Requests During Hospitalization: None Spiritual Requests During Hospitalization: None Consults Spiritual Care Consult Needed: No Transition of Care Team Consult Needed: No Advance Directives (For Healthcare) Does Patient Have a Medical Advance Directive?: Unable to assess, patient is non-responsive or altered mental status Would patient like information on creating a medical advance directive?: No - Patient declined          Disposition: Adaku Anike, NP, reviewed pt's chart and information and determined pt should be observed overnight for safety and stability and re-assessed by psychiatry. This information was provided to pt's nurse at 0727.   Disposition Initial Assessment Completed for this Encounter: Yes Patient referred to: Other (Comment)(Pt will be observed overnight for safety and stability)  This  service was provided via telemedicine using a 2-way, interactive audio and video technology.  Names of all persons participating in this telemedicine service and their role in this encounter. Name: Brad Pollard Role: Patient  Name: Talbot Grumbling Role: Nurse Practitioner  Name: Brad Pollard Role: Clinician    Dannielle Burn 10/12/2019 6:49 AM

## 2019-10-12 NOTE — ED Notes (Addendum)
This RN spoke with Sam from TTS who sts pt will need to be assessed by psych team this morning who will then determine pt dispo. Pt resting comfortably in stretcher at this time. Arousable to voice. Denies SI or HI. NAD. No complaints at this time.

## 2019-11-01 ENCOUNTER — Encounter (HOSPITAL_COMMUNITY): Payer: Self-pay | Admitting: Emergency Medicine

## 2019-11-01 ENCOUNTER — Other Ambulatory Visit: Payer: Self-pay

## 2019-11-01 ENCOUNTER — Emergency Department (HOSPITAL_COMMUNITY)

## 2019-11-01 ENCOUNTER — Emergency Department (HOSPITAL_COMMUNITY)
Admission: EM | Admit: 2019-11-01 | Discharge: 2019-11-01 | Disposition: A | Discharge status: Home / Self Care | Attending: Emergency Medicine | Admitting: Emergency Medicine

## 2019-11-01 DIAGNOSIS — M545 Low back pain: Secondary | ICD-10-CM | POA: Insufficient documentation

## 2019-11-01 DIAGNOSIS — Y999 Unspecified external cause status: Secondary | ICD-10-CM | POA: Diagnosis not present

## 2019-11-01 DIAGNOSIS — S63502A Unspecified sprain of left wrist, initial encounter: Secondary | ICD-10-CM | POA: Diagnosis not present

## 2019-11-01 DIAGNOSIS — R519 Headache, unspecified: Secondary | ICD-10-CM | POA: Diagnosis not present

## 2019-11-01 DIAGNOSIS — Y92012 Bathroom of single-family (private) house as the place of occurrence of the external cause: Secondary | ICD-10-CM | POA: Diagnosis not present

## 2019-11-01 DIAGNOSIS — W182XXA Fall in (into) shower or empty bathtub, initial encounter: Secondary | ICD-10-CM | POA: Insufficient documentation

## 2019-11-01 DIAGNOSIS — Z79899 Other long term (current) drug therapy: Secondary | ICD-10-CM | POA: Insufficient documentation

## 2019-11-01 DIAGNOSIS — T07XXXA Unspecified multiple injuries, initial encounter: Secondary | ICD-10-CM | POA: Diagnosis present

## 2019-11-01 DIAGNOSIS — Y93E1 Activity, personal bathing and showering: Secondary | ICD-10-CM | POA: Insufficient documentation

## 2019-11-01 DIAGNOSIS — W19XXXA Unspecified fall, initial encounter: Secondary | ICD-10-CM

## 2019-11-01 DIAGNOSIS — S0990XA Unspecified injury of head, initial encounter: Secondary | ICD-10-CM

## 2019-11-01 DIAGNOSIS — F17210 Nicotine dependence, cigarettes, uncomplicated: Secondary | ICD-10-CM | POA: Diagnosis not present

## 2019-11-01 MED ORDER — METHOCARBAMOL 500 MG PO TABS: 500.0000 mg | ORAL_TABLET | Freq: Two times a day (BID) | ORAL | 0 refills | Status: AC

## 2019-11-01 MED ORDER — ACETAMINOPHEN 325 MG PO TABS
650.0000 mg | ORAL_TABLET | Freq: Once | ORAL | Status: AC
  Administered 2019-11-01: 650 mg via ORAL
  Filled 2019-11-01: qty 2

## 2019-11-01 NOTE — ED Triage Notes (Addendum)
Pt to triage via [facility] from home.  Pt fell in shower and hit head.  Denies LOC.  C/o Lower back pain that radiates down R leg, headache, and L wrist pain.   No blood thinners.  Denies neck pain.

## 2019-11-01 NOTE — ED Notes (Signed)
Discharge instructions discussed with Pt. Pt verbalized understanding. Pt stable and ambulatory.    

## 2019-11-01 NOTE — ED Provider Notes (Signed)
[facility] Provider Note   CSN: [id number] Arrival date & time: 11/01/19  1550     History Chief Complaint  Patient presents with  . Fall    Brad Pollard is a 34 y.o. male with a past medical history of hypertension presenting to the ED with a chief complaint of fall.  States that just prior to arrival he was in the shower when a piece of the flooring caused him to trip and fall backwards.  Denies any loss of consciousness but states that he did strike the back of his head and has been having constant headaches since then.  Also reports left wrist pain and lower back pain causing sharp shooting pain down his right leg.  He has had a prior back surgery in 2017, had a history of fusion in his left wrist in 2012 in [place] so he has limited range of motion secondary to this at baseline.  Denies any vision changes, vomiting, loss of bowel bladder function, numbness in arms or legs, anticoagulant use, neck pain or stiffness.  HPI     Past Medical History:  Diagnosis Date  . Hepatitis C   . Hypertension     Patient Active Problem List   Diagnosis Date Noted  . Psychoactive substance-induced psychosis ([place]) 10/12/2019  . Major depressive disorder, single episode, severe with psychotic features ([place]) 09/14/2019  . Severe recurrent major depressive disorder with psychotic features ([place]) 09/14/2019  . Acute psychosis ([place]) 09/13/2019    History reviewed. No pertinent surgical history.     No family history on file.  Social History   Tobacco Use  . Smoking status: Current Every Day Smoker    Packs/day: 1.00  . Smokeless tobacco: Never Used  Substance Use Topics  . Alcohol use: Yes    Comment: fews beers on occasion  . 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 Authorizing  Provider  hydrOXYzine (ATARAX/VISTARIL) 25 MG tablet Take 1 tablet (25 mg total) by mouth every 6 (six) hours as needed for anxiety. 09/18/19  Yes Connye Burkitt, NP  lisinopril (ZESTRIL) 10 MG tablet Take 1 tablet (10 mg total) by mouth daily. 09/19/19  Yes Connye Burkitt, NP  QUEtiapine (SEROQUEL) 100 MG tablet Take 1 tablet (100 mg total) by mouth at bedtime. 09/18/19  Yes Connye Burkitt, NP  QUEtiapine (SEROQUEL) 25 MG tablet Take 1 tablet (25 mg total) by mouth every morning. 09/19/19  Yes Connye Burkitt, NP  sertraline (ZOLOFT) 50 MG tablet Take 1 tablet (50 mg total) by mouth daily. 09/19/19  Yes Connye Burkitt, NP  methocarbamol (ROBAXIN) 500 MG tablet Take 1 tablet (500 mg total) by mouth 2 (two) times daily. 11/01/19   [name], PA-C  nicotine (NICODERM CQ - DOSED IN MG/24 HOURS) 21 mg/24hr patch Place 1 patch (21 mg total) onto the skin daily at 6 (six) AM. Patient not taking: Reported on 11/01/2019 09/19/19   Connye Burkitt, NP    Allergies    Patient has no known allergies.  Review of Systems   Review of Systems  Constitutional: Negative for appetite change, chills and fever.  HENT: Negative for ear pain, rhinorrhea, sneezing and sore throat.   Eyes: Negative for photophobia and visual disturbance.  Respiratory: Negative for  cough, chest tightness, shortness of breath and wheezing.   Cardiovascular: Negative for chest pain and palpitations.  Gastrointestinal: Negative for abdominal pain, blood in stool, constipation, diarrhea, nausea and vomiting.  Genitourinary: Negative for dysuria, hematuria and urgency.  Musculoskeletal: Positive for arthralgias, back pain and myalgias.  Skin: Negative for rash.  Neurological: Positive for headaches. Negative for dizziness, weakness and light-headedness.    Physical Exam Updated Vital Signs BP 138/86 (BP Location: Right Arm)   Pulse 78   Temp 98.7 F (37.1 C) (Oral)   Resp 16   SpO2 100%   Physical Exam Vitals and nursing note  reviewed.  Constitutional:      General: He is not in acute distress.    Appearance: He is well-developed.  HENT:     Head: Normocephalic and atraumatic.     Nose: Nose normal.  Eyes:     General: No scleral icterus.       Right eye: No discharge.        Left eye: No discharge.     Conjunctiva/sclera: Conjunctivae normal.     Pupils: Pupils are equal, round, and reactive to light.  Cardiovascular:     Rate and Rhythm: Normal rate and regular rhythm.     Heart sounds: Normal heart sounds. No murmur. No friction rub. No gallop.   Pulmonary:     Effort: Pulmonary effort is normal. No respiratory distress.     Breath sounds: Normal breath sounds.  Abdominal:     General: Bowel sounds are normal. There is no distension.     Palpations: Abdomen is soft.     Tenderness: There is no abdominal tenderness. There is no guarding.  Musculoskeletal:        General: Tenderness present. Normal range of motion.     Cervical back: Normal range of motion and neck supple.     Lumbar back: Tenderness and bony tenderness present.       Back:     Comments: No midline spinal tenderness present in lumbar, thoracic or cervical spine. No step-off palpated. No visible bruising, edema or temperature change noted. No objective signs of numbness present. No saddle anesthesia. 2+ DP pulses bilaterally. Sensation intact to light touch. Strength 5/5 in bilateral lower extremities. Limited range of motion of left wrist.  No obvious deformity, swelling or overlying skin changes noted.  Skin:    General: Skin is warm and dry.     Findings: No rash.  Neurological:     General: No focal deficit present.     Mental Status: He is alert and oriented to person, place, and time.     Cranial Nerves: No cranial nerve deficit.     Sensory: No sensory deficit.     Motor: No weakness or abnormal muscle tone.     Coordination: Coordination normal.     ED Results / Procedures / Treatments   Labs (all labs ordered are  listed, but only abnormal results are displayed) Labs Reviewed - No data to display  EKG None  Radiology DG Lumbar Spine Complete  Result Date: 11/01/2019 CLINICAL DATA:  Fall EXAM: LUMBAR SPINE - COMPLETE 4+ VIEW COMPARISON:  06/24/2018, chest x-ray 10/11/2019 FINDINGS: Lumbar alignment within normal limits. Suspected transitional anatomy. Hypoplastic ribs at T12 with 4 non rib-bearing lumbar type vertebra. Vertebral body heights are maintained. Mild disc space narrowing at L4-S1. Remaining disc spaces appear patent. IMPRESSION: No acute osseous abnormality. Electronically Signed   By: Jasmine Pang M.D.   On: 11/01/2019  18:13   DG Wrist Complete Left  Result Date: 11/01/2019 CLINICAL DATA:  Fall EXAM: LEFT WRIST - COMPLETE 3+ VIEW COMPARISON:  None. FINDINGS: No acute displaced fracture is visualized. Long fixating plate and surgical screws extending from the third metacarpal to the shaft of the radius. Osseous fusion across the radius and carpal bones. There is screw fracture of the first and third screws the metacarpal end of the plate. There is prominent lucency about the fixating plate at the third metacarpal consistent with loosening. IMPRESSION: 1. No acute fracture is seen 2. Postsurgical changes of the wrist with fixating plate extending from third metacarpal to the distal radius with associated post fusion changes. Findings suspicious for hardware loosening at the metacarpal end of the plate with prominent lucency noted. Fractures through the first and third screws at the metacarpal and of the plate. Electronically Signed   By: Jasmine Pang M.D.   On: 11/01/2019 18:11   CT Head Wo Contrast  Result Date: 11/01/2019 CLINICAL DATA:  Fall EXAM: CT HEAD WITHOUT CONTRAST TECHNIQUE: Contiguous axial images were obtained from the base of the skull through the vertex without intravenous contrast. COMPARISON:  Report 07/19/2000 FINDINGS: Brain: No acute territorial infarction, hemorrhage, or  intracranial mass. The ventricles are nonenlarged Vascular: No hyperdense vessel or unexpected calcification. Skull: Normal. Negative for fracture or focal lesion. Sinuses/Orbits: Mucosal thickening in the ethmoid and sphenoid and maxillary sinuses Other: None IMPRESSION: Negative non contrasted CT appearance of the brain Electronically Signed   By: Jasmine Pang M.D.   On: 11/01/2019 18:15    Procedures Procedures (including critical care time)  Medications Ordered in ED Medications  acetaminophen (TYLENOL) tablet 650 mg (650 mg Oral Given 11/01/19 1828)    ED Course  I have reviewed the triage vital signs and the nursing notes.  Pertinent labs & imaging results that were available during my care of the patient were reviewed by me and considered in my medical decision making (see chart for details).    MDM Rules/Calculators/A&P                      34 year old male presents to ED with a chief complaint of fall.  Had a mechanical fall in the shower, fell backwards and struck his head on the floor.  He complains of right lower back pain, left wrist pain.  He has had a prior wrist fusion and back surgeries in the past.  He remains ambulatory.  No neurological deficits noted.  X-ray of the lumbar spine is negative.  CT of the head is negative.  There is some loosening of hardware as well as fractures of the screws seen on x-ray of the left wrist.  Areas neurovascularly intact.  I consulted Dr. Melvyn Novas, on-call for hand surgery who feels that patient is reasonable for follow-up in the clinic either with him or with his prior surgeon who did the surgery.  Informed patient of this.  We will give him Robaxin to help with muscle strain and have him follow-up with orthopedics and spine specialist.  Patient is agreeable to the plan.  Patient is hemodynamically stable, in NAD, and able to ambulate in the ED. Evaluation does not show pathology that would require ongoing emergent intervention or inpatient  treatment. I have personally reviewed and interpreted all lab work and imaging at today's ED visit. I explained the diagnosis to the patient. Pain has been managed and has no complaints prior to discharge. Patient is comfortable with  above plan and is stable for discharge at this time. All questions were answered prior to disposition. Strict return precautions for returning to the ED were discussed. Encouraged follow up with PCP.   An After Visit Summary was printed and given to the patient.   Portions of this note were generated with Scientist, clinical (histocompatibility and immunogenetics). Dictation errors may occur despite best attempts at proofreading.  Final Clinical Impression(s) / ED Diagnoses Final diagnoses:  Fall, initial encounter  Injury of head, initial encounter  Sprain of left wrist, initial encounter    Rx / DC Orders ED Discharge Orders         Ordered    methocarbamol (ROBAXIN) 500 MG tablet  2 times daily     11/01/19 1932           Dietrich Pates, PA-C 11/01/19 1935    Melene Plan, DO 11/01/19 2100

## 2019-11-01 NOTE — Discharge Instructions (Signed)
Wear the splint as directed. Use the medications as prescribed to help with your pain. Return to the ER if you start to experience additional injuries, chest pain, shortness of breath, numbness in arms or legs, losing control of your bowels or bladder.

## 2019-11-01 NOTE — ED Notes (Signed)
Pt transported to CT via wheelchair.

## 2019-11-08 ENCOUNTER — Other Ambulatory Visit: Payer: Self-pay

## 2019-11-08 ENCOUNTER — Emergency Department (HOSPITAL_COMMUNITY)

## 2019-11-08 ENCOUNTER — Emergency Department (HOSPITAL_COMMUNITY)
Admission: EM | Admit: 2019-11-08 | Discharge: 2019-11-08 | Disposition: A | Discharge status: Home / Self Care | Attending: Emergency Medicine | Admitting: Emergency Medicine

## 2019-11-08 DIAGNOSIS — M545 Low back pain, unspecified: Secondary | ICD-10-CM

## 2019-11-08 DIAGNOSIS — I10 Essential (primary) hypertension: Secondary | ICD-10-CM | POA: Diagnosis not present

## 2019-11-08 DIAGNOSIS — F17200 Nicotine dependence, unspecified, uncomplicated: Secondary | ICD-10-CM | POA: Insufficient documentation

## 2019-11-08 DIAGNOSIS — Z79899 Other long term (current) drug therapy: Secondary | ICD-10-CM | POA: Diagnosis not present

## 2019-11-08 DIAGNOSIS — M25532 Pain in left wrist: Secondary | ICD-10-CM

## 2019-11-08 DIAGNOSIS — M79642 Pain in left hand: Secondary | ICD-10-CM | POA: Diagnosis not present

## 2019-11-08 MED ORDER — IBUPROFEN 400 MG PO TABS: 400.0000 mg | ORAL_TABLET | Freq: Four times a day (QID) | ORAL | 0 refills | Status: DC | PRN

## 2019-11-08 MED ORDER — ACETAMINOPHEN 325 MG PO TABS: 650.0000 mg | ORAL_TABLET | Freq: Four times a day (QID) | ORAL | 0 refills | Status: DC | PRN

## 2019-11-08 NOTE — Discharge Instructions (Addendum)
You may alternate taking Tylenol and Ibuprofen as needed for pain control. You may take 400-600 mg of ibuprofen every 6 hours and [phone] mg of Tylenol every 6 hours. Do not exceed 4000 mg of Tylenol daily as this can lead to liver damage. Also, make sure to take Ibuprofen with meals as it can cause an upset stomach. Do not take other NSAIDs while taking Ibuprofen such as (Aleve, Naprosyn, Aspirin, Celebrex, etc) and do not take more than the prescribed dose as this can lead to ulcers and bleeding in your GI tract. You may use warm and cold compresses to help with your symptoms.   Please follow up with [facility] within the next 7-10 days for re-evaluation and further treatment of your symptoms.   Please return to the ER sooner if you have any new or worsening symptoms.

## 2019-11-08 NOTE — ED Provider Notes (Signed)
[name] Provider Note   CSN: [id number] Arrival date & time: 11/08/19  1337     History Chief Complaint  Patient presents with  . Hand Pain  . Back Pain    Brad Pollard is a 34 y.o. male.  HPI   34 year old male with a history of hep C, hypertension, who presents to the emergency department today for evaluation of left wrist/hand pain.  States that he was seen in the ED last week for evaluation after a fall in the shower.  Fell onto an outstretched hand.  Has pain to the left wrist.  Also complaining of pain to the right lower back that radiates down the right lower extremity.  Denies any weakness to the bilateral lower extremities.  Denies any loss control of bowel or bladder function.  Has been ambulatory.  He has been taking Tylenol at his facility without significant relief.  States he was told to follow-up with hand surgery however due to insurance reasons there has been a delay in follow-up.  The approval for him to follow-up with hand surgery is still in process.  Denies any numbness to the hand.  Past Medical History:  Diagnosis Date  . Hepatitis C   . Hypertension     Patient Active Problem List   Diagnosis Date Noted  . 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 Major depressive disorder, single episode, severe with psychotic features ([place]) 09/14/2019  . Severe recurrent major depressive disorder with psychotic features ([place]) 09/14/2019  . Acute psychosis ([place]) 09/13/2019    No past surgical history on file.     No family history on file.  Social History   Tobacco Use  . Smoking status: Current Every Day Smoker    Packs/day: 1.00  . Smokeless tobacco: Never Used  Substance Use Topics  . Alcohol use: Yes    Comment: fews beers on occasion  . 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 Authorizing Provider  lisinopril (ZESTRIL) 10 MG tablet Take 1 tablet (10 mg total) by mouth daily. 09/19/19  Yes Connye Burkitt, NP  methocarbamol (ROBAXIN) 500 MG tablet Take 1 tablet (500 mg total) by mouth 2 (two) times daily. 11/01/19  Yes Khatri, Hina, PA-C  QUEtiapine (SEROQUEL) 100 MG tablet Take 1 tablet (100 mg total) by mouth at bedtime. 09/18/19  Yes Connye Burkitt, NP  QUEtiapine (SEROQUEL) 25 MG tablet Take 1 tablet (25 mg total) by mouth every morning. 09/19/19  Yes Connye Burkitt, NP  sertraline (ZOLOFT) 50 MG tablet Take 1 tablet (50 mg total) by mouth daily. 09/19/19  Yes Connye Burkitt, NP  acetaminophen (TYLENOL) 325 MG tablet Take 2 tablets (650 mg total) by mouth every 6 (six) hours as needed. Do not take more than 4000mg  of tylenol per day 11/08/19   Antron Seth S, PA-C  hydrOXYzine (ATARAX/VISTARIL) 25 MG tablet Take 1 tablet (25 mg total) by mouth every 6 (six) hours as needed for anxiety. 09/18/19   Connye Burkitt, NP  ibuprofen (ADVIL) 400 MG tablet Take 1 tablet (400 mg total) by mouth every 6 (six) hours as needed. 11/08/19   Jennah Satchell S, PA-C  nicotine (NICODERM CQ - DOSED IN MG/24 HOURS) 21 mg/24hr patch Place 1 patch (21 mg total)  onto the skin daily at 6 (six) AM. Patient not taking: Reported on 11/08/2019 09/19/19   Aldean Baker, NP    Allergies    Patient has no known allergies.  Review of Systems   Review of Systems  Gastrointestinal:       No loss of control of bowel function  Genitourinary:       No loss of control of bladder function  Musculoskeletal: Positive for back pain.       Left wrist/hand pain  Neurological: Negative for weakness.    Physical Exam Updated Vital Signs BP (!) 145/95 (BP Location: Left Arm)   Pulse 92   Temp 98.3 F (36.8 C) (Oral)   Resp 16   SpO2 100%   Physical Exam Constitutional:      General: He is not in acute distress.    Appearance: He is  well-developed.  Eyes:     Conjunctiva/sclera: Conjunctivae normal.  Cardiovascular:     Rate and Rhythm: Normal rate and regular rhythm.  Pulmonary:     Effort: Pulmonary effort is normal.     Breath sounds: Normal breath sounds.  Musculoskeletal:     Comments: No midline lumbar TTP. TTP noted to the right lumbar paraspinous muscles. TTP to the left anatomical snuff box. Normal sensation to the hand/wrist. Normal ROM. Compartments are sift.  Skin:    General: Skin is warm and dry.  Neurological:     Mental Status: He is alert and oriented to person, place, and time.     ED Results / Procedures / Treatments   Labs (all labs ordered are listed, but only abnormal results are displayed) Labs Reviewed - No data to display  EKG None  Radiology DG Wrist Complete Left  Result Date: 11/08/2019 CLINICAL DATA:  Pain EXAM: LEFT WRIST - COMPLETE 3+ VIEW COMPARISON:  11/01/2019 FINDINGS: No acute fracture or dislocation of the left wrist. There is redemonstrated plate and screw fixation across the dorsal carpus, with redemonstrated screw fracture and perihardware lucency about the plate and screws at the left third metacarpal. 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 Soft tissues are unremarkable. IMPRESSION: 1. No acute fracture or dislocation of the left wrist. 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 2. Stable appearance of plate and screw fixation across the dorsal carpus, with redemonstrated screw fracture and perihardware lucency about the left third metacarpal. Electronically Signed   By: Lauralyn Primes M.D.   On: 11/08/2019 16:03    Procedures Procedures (including critical care time)  Medications Ordered in ED Medications - No data to display  ED Course  I have reviewed the triage vital signs and the nursing notes.  Pertinent labs & imaging results that were available during my care of the patient were reviewed by me and considered in my  medical decision making (see chart for details).    MDM Rules/Calculators/A&P                      34 year old male presenting for evaluation after a fall last week.  Is complaining of persistent pain to the left wrist and back.  He is only taking Tylenol for pain.  On exam he has tenderness to the left anatomical snuffbox.  He also has tenderness to the right lower paraspinous muscles.  He is not been able to follow-up with hand yet however he is currently in the process of trying to get this approved by his insurance.  Repeat x-ray today was personally reviewed/interpreted and there is no evidence of scaphoid fracture.  X-ray did redemonstrate loosening of his hardware from prior surgery.  Will recommend continuation of wearing the splint.  Will give Tylenol, Motrin for pain relief.  Have given information for orthopedics follow-up and advised on return precautions.  He voiced understanding of plan and reasons to return.  All questions answered.  Patient stable for discharge.  Final Clinical Impression(s) / ED Diagnoses Final diagnoses:  Left wrist pain  Left hand pain  Acute right-sided low back pain, unspecified whether sciatica present    Rx / DC Orders ED Discharge Orders         Ordered    acetaminophen (TYLENOL) 325 MG tablet  Every 6 hours PRN     11/08/19 1630    ibuprofen (ADVIL) 400 MG tablet  Every 6 hours PRN     11/08/19 [address] Roselani Grajeda S, PA-C 11/08/19 1639    Jacalyn Lefevre, MD 11/08/19 1753

## 2019-11-08 NOTE — ED Triage Notes (Signed)
Pt here for continued back and hand pain since slipping in the shower last week. Seen for same and is supposed to follow up with ortho [facility] and he states they refuse to approve for him to go. Pt ambulatory without distress, still wearing splint from last week.

## 2019-11-17 ENCOUNTER — Other Ambulatory Visit: Payer: Self-pay

## 2019-11-17 ENCOUNTER — Encounter (HOSPITAL_COMMUNITY): Payer: Self-pay | Admitting: Emergency Medicine

## 2019-11-17 ENCOUNTER — Emergency Department (HOSPITAL_COMMUNITY)
Admission: EM | Admit: 2019-11-17 | Discharge: 2019-11-18 | Discharge status: Against Medical Advice | Attending: Emergency Medicine | Admitting: Emergency Medicine

## 2019-11-17 DIAGNOSIS — R109 Unspecified abdominal pain: Secondary | ICD-10-CM | POA: Diagnosis not present

## 2019-11-17 DIAGNOSIS — F17210 Nicotine dependence, cigarettes, uncomplicated: Secondary | ICD-10-CM | POA: Insufficient documentation

## 2019-11-17 DIAGNOSIS — I10 Essential (primary) hypertension: Secondary | ICD-10-CM | POA: Insufficient documentation

## 2019-11-17 DIAGNOSIS — Z79899 Other long term (current) drug therapy: Secondary | ICD-10-CM | POA: Diagnosis not present

## 2019-11-17 LAB — CBC
HCT: 45.6 % (ref 39.0–52.0)
Hemoglobin: 15.2 g/dL (ref 13.0–17.0)
MCH: 30.8 pg (ref 26.0–34.0)
MCHC: 33.3 g/dL (ref 30.0–36.0)
MCV: 92.3 fL (ref 80.0–100.0)
Platelets: 259 10*3/uL (ref 150–400)
RBC: 4.94 MIL/uL (ref 4.22–5.81)
RDW: 13.2 % (ref 11.5–15.5)
WBC: 12.8 10*3/uL — ABNORMAL HIGH (ref 4.0–10.5)
nRBC: 0 % (ref 0.0–0.2)

## 2019-11-17 LAB — COMPREHENSIVE METABOLIC PANEL
ALT: 41 U/L (ref 0–44)
AST: 25 U/L (ref 15–41)
Albumin: 4.2 g/dL (ref 3.5–5.0)
Alkaline Phosphatase: 60 U/L (ref 38–126)
Anion gap: 10 (ref 5–15)
BUN: 13 mg/dL (ref 6–20)
CO2: 26 mmol/L (ref 22–32)
Calcium: 10.1 mg/dL (ref 8.9–10.3)
Chloride: 101 mmol/L (ref 98–111)
Creatinine, Ser: 1.05 mg/dL (ref 0.61–1.24)
GFR calc Af Amer: >60 mL/min (ref >60)
GFR calc non Af Amer: >60 mL/min (ref >60)
Glucose, Bld: 134 mg/dL — ABNORMAL HIGH (ref 70–99)
Potassium: 3.9 mmol/L (ref 3.5–5.1)
Sodium: 137 mmol/L (ref 135–145)
Total Bilirubin: 0.7 mg/dL (ref 0.3–1.2)
Total Protein: 8 g/dL (ref 6.5–8.1)

## 2019-11-17 LAB — URINALYSIS, ROUTINE W REFLEX MICROSCOPIC
Bilirubin Urine: NEGATIVE
Glucose, UA: NEGATIVE mg/dL
Hgb urine dipstick: NEGATIVE
Ketones, ur: 5 mg/dL — AB
Leukocytes,Ua: NEGATIVE
Nitrite: NEGATIVE
Protein, ur: NEGATIVE mg/dL
Specific Gravity, Urine: 1.025 (ref 1.005–1.030)
pH: 6 (ref 5.0–8.0)

## 2019-11-17 LAB — LIPASE, BLOOD: Lipase: 20 U/L (ref 11–51)

## 2019-11-17 MED ORDER — ACETAMINOPHEN 500 MG PO TABS
1000.0000 mg | ORAL_TABLET | Freq: Once | ORAL | Status: AC
  Administered 2019-11-18: 1000 mg via ORAL
  Filled 2019-11-17: qty 2

## 2019-11-17 MED ORDER — SODIUM CHLORIDE 0.9% FLUSH
3.0000 mL | Freq: Once | INTRAVENOUS | Status: AC
  Administered 2019-11-18: 3 mL via INTRAVENOUS

## 2019-11-17 MED ORDER — KETOROLAC TROMETHAMINE 30 MG/ML IJ SOLN
30.0000 mg | Freq: Once | INTRAMUSCULAR | Status: AC
  Administered 2019-11-18: 30 mg via INTRAVENOUS
  Filled 2019-11-17: qty 1

## 2019-11-17 NOTE — ED Notes (Signed)
Pt refuses to stay in room with door closed. Pt states that he is going to act out if he doesn't get a tylenol.

## 2019-11-17 NOTE — ED Triage Notes (Signed)
Patient is complaining of abdominal pain and urine retention x 2. Patient has nausea. PIV # 18 Right AC, zofran 4mg .

## 2019-11-18 ENCOUNTER — Emergency Department (HOSPITAL_COMMUNITY)

## 2019-11-18 NOTE — ED Notes (Signed)
Patient asking to leave as he has been waiting too long for any pain medications. EDP Tresa Endo made aware. EDP stuck in patient room and said she would finish discharge paperwork when she was done. Patient informed that it would be a few more minutes, however, patient said he wanted to leave AMA. Patient signed and left.

## 2019-11-18 NOTE — ED Provider Notes (Signed)
[facility] DEPT Provider Note   CSN: [id number] Arrival date & time: 11/17/19  2220     History Chief Complaint  Patient presents with  . Abdominal Pain    Brad Pollard is a 34 y.o. male.  34 year old male with a history of hypertension, hepatitis C, polysubstance abuse presents to the emergency department for evaluation of abdominal pain x2 days.  He states that pain has been constant to his left mid abdomen.  It is worse when being asked to lie on the bed.  He has not taken any medications for his symptoms and is requesting Tylenol.  Complains of nausea associated with his pain.  No vomiting, diarrhea, melena, hematochezia, dysuria, difficulty/inability to void.  Further denies trauma and heavy lifting as well as bowel/bladder incontinence, extremity numbness/paresthesias, inability to ambulate.  No hx of abdominal surgeries.  He is very agitated, but states he has been out of his psychiatric medications x 2 weeks.  The history is provided by the patient. No language interpreter was used.  Abdominal Pain      Past Medical History:  Diagnosis Date  . Hepatitis C   . Hypertension     Patient Active Problem List   Diagnosis Date Noted  . 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 Major depressive disorder, single episode, severe with psychotic features ([place]) 09/14/2019  . Severe recurrent major depressive disorder with psychotic features ([place]) 09/14/2019  . Acute psychosis ([place]) 09/13/2019    History reviewed. No pertinent surgical history.     History reviewed. No pertinent family history.  Social History   Tobacco Use  . Smoking status: Current Every Day Smoker    Packs/day: 1.00  . Smokeless tobacco: Never Used  Substance Use Topics  . Alcohol use: Yes    Comment: fews beers on occasion  . 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 Authorizing Provider  acetaminophen (TYLENOL) 325 MG tablet Take 2 tablets (650 mg total) by mouth every 6 (six) hours as needed. Do not take more than 4000mg  of tylenol per day 11/08/19   Couture, Cortni S, PA-C  hydrOXYzine (ATARAX/VISTARIL) 25 MG tablet Take 1 tablet (25 mg total) by mouth every 6 (six) hours as needed for anxiety. 09/18/19   Connye Burkitt, NP  ibuprofen (ADVIL) 400 MG tablet Take 1 tablet (400 mg total) by mouth every 6 (six) hours as needed. 11/08/19   Couture, Cortni S, PA-C  lisinopril (ZESTRIL) 10 MG tablet Take 1 tablet (10 mg total) by mouth daily. 09/19/19   Connye Burkitt, NP  methocarbamol (ROBAXIN) 500 MG tablet Take 1 tablet (500 mg total) by mouth 2 (two) times daily. 11/01/19   Khatri, Hina, PA-C  nicotine (NICODERM CQ - DOSED IN MG/24 HOURS) 21 mg/24hr patch Place 1 patch (21 mg total) onto the skin daily at 6 (six) AM. Patient not taking: Reported on 11/08/2019 09/19/19   Connye Burkitt, NP  QUEtiapine (SEROQUEL) 100 MG tablet Take 1 tablet (100 mg total) by mouth at bedtime. 09/18/19   Connye Burkitt, NP  QUEtiapine (SEROQUEL) 25 MG tablet Take 1 tablet (25 mg total) by mouth every morning. 09/19/19   Connye Burkitt, NP  sertraline (ZOLOFT) 50 MG tablet Take 1 tablet (50 mg total) by mouth daily. 09/19/19  Aldean Baker, NP    Allergies    Patient has no known allergies.  Review of Systems   Review of Systems  Gastrointestinal: Positive for abdominal pain.  Ten systems reviewed and are negative for acute change, except as noted in the HPI.    Physical Exam Updated Vital Signs BP 129/80   Pulse (!) 50   Temp 98.6 F (37 C)   Resp 19   SpO2 100%   Physical Exam Vitals and nursing note reviewed.  Constitutional:      General: He is not in acute distress.    Appearance: He is well-developed. He is not diaphoretic.     Comments: Pacing around room. Nontoxic appearing.  Multiple tattoos.  HENT:     Head: Normocephalic and atraumatic.  Eyes:     General: No scleral icterus.    Conjunctiva/sclera: Conjunctivae normal.  Cardiovascular:     Rate and Rhythm: Normal rate and regular rhythm.     Pulses: Normal pulses.  Pulmonary:     Effort: Pulmonary effort is normal. No respiratory distress.     Comments: Respirations even and unlabored Abdominal:     General: There is no distension.     Palpations: Abdomen is soft. There is no mass.     Tenderness: There is no guarding.     Comments: Minimal TTP to the L mid abdomen. No guarding, masses, distension. No peritoneal signs.  Musculoskeletal:        General: Normal range of motion.     Cervical back: Normal range of motion.     Comments: Mild TTP to the left lower thoracic and lumbar paraspinal muscles. No appreciable spasm.  Skin:    General: Skin is warm and dry.     Coloration: Skin is not pale.     Findings: No erythema or rash.  Neurological:     Mental Status: He is alert and oriented to person, place, and time.  Psychiatric:        Mood and Affect: Affect is angry.        Speech: Speech normal.        Behavior: Behavior is agitated.     ED Results / Procedures / Treatments   Labs (all labs ordered are listed, but only abnormal results are displayed) Labs Reviewed  COMPREHENSIVE METABOLIC PANEL - Abnormal; Notable for the following components:      Result Value   Glucose, Bld 134 (*)    All other components within normal limits  CBC - Abnormal; Notable for the following components:   WBC 12.8 (*)    All other components within normal limits  URINALYSIS, ROUTINE W REFLEX MICROSCOPIC - Abnormal; Notable for the following components:   APPearance HAZY (*)    Ketones, ur 5 (*)    All other components within normal limits  LIPASE, BLOOD    EKG None  Radiology No results found.  Procedures Procedures (including critical care time)  Medications Ordered in ED Medications  sodium  chloride flush (NS) 0.9 % injection 3 mL (3 mLs Intravenous Given 11/18/19 0016)  ketorolac (TORADOL) 30 MG/ML injection 30 mg (30 mg Intravenous Given 11/18/19 0014)  acetaminophen (TYLENOL) tablet 1,000 mg (1,000 mg Oral Given 11/18/19 0014)    ED Course  I have reviewed the triage vital signs and the nursing notes.  Pertinent labs & imaging results that were available during my care of the patient were reviewed by me and considered in my medical decision making (see chart  for details).  Clinical Course as of Nov 17 99  Sat Nov 18, 2019  0017 PVR 106cc without concern for retention. Denies incontinence and lower extremity numbness/weakness. He is ambulatory. No red flags or signs concerning for cauda equina.   [KH]    Clinical Course User Index [KH] Darylene Price   MDM Rules/Calculators/A&P                      34 year old male presents to the emergency department for complaints of abdominal pain x2 days.  His abdominal exam is fairly benign with only mild tenderness in the left mid abdomen.  No peritoneal signs, palpable masses, guarding.  Also has some tenderness to his left flank.  Question musculoskeletal etiology as laboratory evaluation generally reassuring.  He does have a leukocytosis which is nonspecific.  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  Noted to be afebrile with stable vital signs.  No criteria for SIRS/sepsis.  Patient pending abdominal ultrasound for further evaluation of symptoms.  Decided that he no longer wish to remain in the department and requested to leave AGAINST MEDICAL ADVICE.  Signed AMA papers with nursing and was seen ambulating out of the department in stable condition.   Final Clinical Impression(s) / ED Diagnoses Final diagnoses:  Abdominal pain, unspecified abdominal location    Rx / DC Orders ED Discharge Orders    None       Antony Madura, PA-C 11/18/19 0102    Zadie Rhine, MD 11/18/19 [phone]

## 2019-11-19 ENCOUNTER — Encounter (HOSPITAL_COMMUNITY): Payer: Self-pay | Admitting: *Deleted

## 2019-11-19 ENCOUNTER — Ambulatory Visit (HOSPITAL_COMMUNITY)
Admission: RE | Admit: 2019-11-19 | Discharge: 2019-11-19 | Disposition: A | Discharge status: Home / Self Care | Attending: Psychiatry | Admitting: Psychiatry

## 2019-11-19 ENCOUNTER — Emergency Department (HOSPITAL_COMMUNITY)

## 2019-11-19 ENCOUNTER — Emergency Department (HOSPITAL_COMMUNITY)
Admission: EM | Admit: 2019-11-19 | Discharge: 2019-11-20 | Disposition: A | Discharge status: Home / Self Care | Attending: Emergency Medicine | Admitting: Emergency Medicine

## 2019-11-19 ENCOUNTER — Other Ambulatory Visit: Payer: Self-pay

## 2019-11-19 DIAGNOSIS — Z79899 Other long term (current) drug therapy: Secondary | ICD-10-CM | POA: Diagnosis not present

## 2019-11-19 DIAGNOSIS — F17210 Nicotine dependence, cigarettes, uncomplicated: Secondary | ICD-10-CM | POA: Insufficient documentation

## 2019-11-19 DIAGNOSIS — I10 Essential (primary) hypertension: Secondary | ICD-10-CM | POA: Diagnosis not present

## 2019-11-19 DIAGNOSIS — B182 Chronic viral hepatitis C: Secondary | ICD-10-CM | POA: Diagnosis not present

## 2019-11-19 DIAGNOSIS — F15259 Other stimulant dependence with stimulant-induced psychotic disorder, unspecified: Secondary | ICD-10-CM | POA: Insufficient documentation

## 2019-11-19 DIAGNOSIS — R0789 Other chest pain: Secondary | ICD-10-CM | POA: Insufficient documentation

## 2019-11-19 LAB — CBC
HCT: 44.3 % (ref 39.0–52.0)
Hemoglobin: 14.6 g/dL (ref 13.0–17.0)
MCH: 30.3 pg (ref 26.0–34.0)
MCHC: 33 g/dL (ref 30.0–36.0)
MCV: 91.9 fL (ref 80.0–100.0)
Platelets: 276 10*3/uL (ref 150–400)
RBC: 4.82 MIL/uL (ref 4.22–5.81)
RDW: 13 % (ref 11.5–15.5)
WBC: 11.4 10*3/uL — ABNORMAL HIGH (ref 4.0–10.5)
nRBC: 0 % (ref 0.0–0.2)

## 2019-11-19 LAB — BASIC METABOLIC PANEL
Anion gap: 12 (ref 5–15)
BUN: 9 mg/dL (ref 6–20)
CO2: 24 mmol/L (ref 22–32)
Calcium: 9.5 mg/dL (ref 8.9–10.3)
Chloride: 99 mmol/L (ref 98–111)
Creatinine, Ser: 1.03 mg/dL (ref 0.61–1.24)
GFR calc Af Amer: >60 mL/min (ref >60)
GFR calc non Af Amer: >60 mL/min (ref >60)
Glucose, Bld: 130 mg/dL — ABNORMAL HIGH (ref 70–99)
Potassium: 3.9 mmol/L (ref 3.5–5.1)
Sodium: 135 mmol/L (ref 135–145)

## 2019-11-19 LAB — TROPONIN I (HIGH SENSITIVITY): Troponin I (High Sensitivity): 4 ng/L (ref <18)

## 2019-11-19 MED ORDER — SODIUM CHLORIDE FLUSH 0.9 % IV SOLN
5.00 | INTRAVENOUS | Status: DC
Start: ? — End: 2019-11-19

## 2019-11-19 MED ORDER — SODIUM CHLORIDE 0.9% FLUSH: 3.0000 mL | Freq: Once | INTRAVENOUS | Status: DC

## 2019-11-19 MED ORDER — SODIUM CHLORIDE FLUSH 0.9 % IV SOLN
5.00 | INTRAVENOUS | Status: DC
Start: 2019-11-18 — End: 2019-11-19

## 2019-11-19 NOTE — H&P (Signed)
Behavioral Health Medical Screening Exam  Brad Pollard is an 34 y.o. male.  Patient presents voluntarily for walk-in assessment to [facility] behavioral health, patient accompanied by group home staff member.  Patient assessed by nurse practitioner.  Patient alert and oriented, answers appropriately. Patient states "I feel like I am in a situation that I do not know how to get out of." Patient reports frustration that his facility representative will not pick up his prescription today at [facility]. Patient denies suicidal and homicidal ideations.  Patient denies auditory and visual hallucinations.  Patient currently resides in a halfway house called [facility].  Patient currently seen outpatient by alcohol and drug services, Dr. Christin Fudge.  Patient reports home meds include Seroquel and Zoloft.  Patient denies access to weapons. Patient reports he was seen at [facility] on Friday night for stomach pain.  Patient seen at [facility] last night at that time they provided prescriptions for his medications to [facility].  Per patient he has been unable to pick up his medications.  Spoke with staff member regarding allowing patient to pick up medications, staff member agrees.  Total Time spent with patient: 30 minutes  Psychiatric Specialty Exam: Physical Exam  Nursing note and vitals reviewed. Constitutional: He is oriented to person, place, and time. He appears well-developed.  HENT:  Head: Normocephalic.  Cardiovascular: Normal rate.  Respiratory: Effort normal.  Musculoskeletal:     Cervical back: Normal range of motion.  Neurological: He is alert and oriented to person, place, and time.  Psychiatric: He has a normal mood and affect. His speech is normal and behavior is normal. Judgment normal. Thought content is paranoid. Cognition and memory are normal.    Review of Systems  Constitutional: Negative.   HENT: Negative.   Eyes: Negative.   Respiratory: Negative.   Cardiovascular:  Negative.   Gastrointestinal: Negative.   Genitourinary: Negative.   Musculoskeletal: Negative.   Skin: Negative.   Neurological: Negative.   Psychiatric/Behavioral: Negative.     Blood pressure (!) 148/94, pulse (!) 101, temperature 99.6 F (37.6 C), resp. rate 20, SpO2 100 %.There is no height or weight on file to calculate BMI.  General Appearance: Casual  Eye Contact:  Good  Speech:  Clear and Coherent and Normal Rate  Volume:  Normal  Mood:  Euthymic  Affect:  Appropriate and Congruent  Thought Process:  Coherent, Goal Directed and Descriptions of Associations: Intact  Orientation:  Full (Time, Place, and Person)  Thought Content:  WDL and Logical  Suicidal Thoughts:  No  Homicidal Thoughts:  No  Memory:  Immediate;   Good Recent;   Good Remote;   Good  Judgement:  Fair  Insight:  Good  Psychomotor Activity:  Normal  Concentration: Concentration: Good and Attention Span: Good  Recall:  Good  Fund of Knowledge:Good  Language: Good  Akathisia:  No  Handed:  Right  AIMS (if indicated):     Assets:  Communication Skills Desire for Improvement Financial Resources/Insurance Housing Intimacy Leisure Time Physical Health Resilience Social Support  Sleep:       Musculoskeletal: Strength & Muscle Tone: within normal limits Gait & Station: normal Patient leans: N/A  Blood pressure (!) 148/94, pulse (!) 101, temperature 99.6 F (37.6 C), resp. rate 20, SpO2 100 %.  Recommendations: Discharge, follow-up with established outpatient provider.   Based on my evaluation the patient does not appear to have an emergency medical condition.  Patrcia Dolly, FNP 11/19/2019, 6:05 PM

## 2019-11-19 NOTE — BH Assessment (Signed)
Assessment Note  Brad Pollard is an 34 y.o. male present to [facility] as walk-in accompanied by staff at [facility], halfway house for released inmates. Patient report feelings of paranoid thinking one person is following him around everywhere he goes. Patient appears anxious as he discussed he has not been on his medication in over two weeks (Seroquel and Zoloft). Reports in 4 days he has only slept 4 hours. Denied suicide/homicidal ideations, and denied auditory/visual hallucination. Denied substance use, however, per chart review patient has history of polysubstance abuse (Methamphanimine and Adderall). Patient reported to Berneice Heinrich, NP, he was seen at [facility] last night at that time they provided prescriptions for his medications to [facility]. Patient currently receiving outpatient services, Dr. Christin Fudge at [facility]. Patient reports he was seen at [facility] on Friday night for stomach pain and last night he was at [facility] but left AMA before being seen.    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 Hepatitis C   . Hypertension     No past surgical history on file.  Family History: No family history on file.  Social History:  reports that he has been smoking. He has been smoking about 1.00 pack per day. He has never used smokeless tobacco. He reports current alcohol use. He reports current drug use. Drugs: Amphetamines and Heroin.  Additional Social History:  Alcohol / Drug Use Pain Medications: see MAR Prescriptions: see MAR Over the Counter: see MAR History of alcohol / drug use?: Yes Substance #1 Name of Substance 1: Methamphetamine 1 - Age of First Use: unknown 1 - Amount (size/oz): unknown 1 - Frequency: unknown 1 - Duration: unknown 1 - Last Use / Amount: unknown Substance #2 Name of Substance 2: Adderall 2 - Age of First Use: unknown 2 - Amount  (size/oz): unknown 2 - Frequency: unknown 2 - Duration: unknown 2 - Last Use / Amount: unknown  CIWA: CIWA-Ar BP: (!) 148/94 Pulse Rate: (!) 101 COWS:    Allergies: No Known Allergies  Home Medications: (Not in a hospital admission)   OB/GYN Status:  No LMP for male patient.  General Assessment Data Location of Assessment: [facility](walk-in) TTS Assessment: In system Is this a Tele or Face-to-Face Assessment?: Face-to-Face Is this an Initial Assessment or a Re-assessment for this encounter?: Initial Assessment Patient Accompanied by:: Other(Dacien Earlene Plater, Financial controller at [employer]) Language Other than English: No Living Arrangements: Other (Comment)([facility], halfway house for released inmates) What gender do you identify as?: Male Marital status: Single Living Arrangements: Other (Comment)([facility], halfway house for released inmates) Can pt return to current living arrangement?: Yes Admission Status: Voluntary Is patient capable of signing voluntary admission?: Yes Referral Source: Self/Family/Friend  Medical Screening Exam [facility] Walk-in ONLY) Medical Exam completed: Yes  Crisis Care Plan Living Arrangements: Other (Comment)([facility], halfway house for released inmates) Name of Psychiatrist: chart review chart - [facility]  Name of Therapist: none report   Education Status Is patient currently in school?: No Is the patient employed, unemployed or receiving disability?: Unemployed  Risk to self with the past 6 months Suicidal Ideation: No Has patient been a risk to self within the past 6 months prior to admission? : No Suicidal Intent: No Has patient had any suicidal intent within the past 6 months prior to admission? : No Is patient at risk for suicide?: No Suicidal Plan?: No Has patient  had any suicidal plan within the past 6 months prior to admission? : No Access to Means: No What has been your use of drugs/alcohol within the  last 12 months?: Methamphetamine & Adderall Previous Attempts/Gestures: No How many times?: 0 Other Self Harm Risks: substance abuse Triggers for Past Attempts: None known Intentional Self Injurious Behavior: None Family Suicide History: No Recent stressful life event(s): Other (Comment)(none report ) Persecutory voices/beliefs?: No Depression: Yes Depression Symptoms: Feeling angry/irritable, Isolating Substance abuse history and/or treatment for substance abuse?: No Suicide prevention information given to non-admitted patients: Not applicable  Risk to Others within the past 6 months Homicidal Ideation: No Does patient have any lifetime risk of violence toward others beyond the six months prior to admission? : No Thoughts of Harm to Others: No Current Homicidal Intent: No Current Homicidal Plan: No Access to Homicidal Means: No Identified Victim: n/a History of harm to others?: No Assessment of Violence: None Noted Violent Behavior Description: None Noted Does patient have access to weapons?: No Criminal Charges Pending?: No Does patient have a court date: No Is patient on probation?: No  Psychosis Hallucinations: None noted Delusions: None noted  Mental Status Report Appearance/Hygiene: Unremarkable Eye Contact: Fair Motor Activity: Freedom of movement Speech: Logical/coherent Level of Consciousness: Alert Mood: Anxious Affect: Anxious Anxiety Level: None Thought Processes: Coherent, Relevant Judgement: Unimpaired Orientation: Person, Place, Time, Situation Obsessive Compulsive Thoughts/Behaviors: None  Cognitive Functioning Concentration: Normal Memory: Recent Intact, Remote Intact Is patient IDD: No Insight: Good Impulse Control: Poor(substance abuse ) Appetite: Poor Have you had any weight changes? : No Change Sleep: Decreased Total Hours of Sleep: (report in the past 4 days only sleep 4 hours ) Vegetative Symptoms: None  ADLScreening [facility] Assessment  Services) Patient's cognitive ability adequate to safely complete daily activities?: Yes Patient able to express need for assistance with ADLs?: Yes Independently performs ADLs?: Yes (appropriate for developmental age)  Prior Inpatient Therapy Prior Inpatient Therapy: Yes Prior Therapy Dates: 2020 Prior Therapy Facilty/Provider(s): [facility] Reason for Treatment: mental health   Prior Outpatient Therapy Prior Outpatient Therapy: Yes Prior Therapy Facilty/Provider(s): [facility]  Reason for Treatment: SA/MH Does patient have an ACCT team?: No Does patient have Intensive In-House Services?  : No Does patient have [facility] services? : No Does patient have [facility] services?: No  ADL Screening (condition at time of admission) Patient's cognitive ability adequate to safely complete daily activities?: Yes Is the patient deaf or have difficulty hearing?: No Does the patient have difficulty seeing, even when wearing glasses/contacts?: No Does the patient have difficulty concentrating, remembering, or making decisions?: No Patient able to express need for assistance with ADLs?: Yes Does the patient have difficulty dressing or bathing?: No Independently performs ADLs?: Yes (appropriate for developmental age) Does the patient have difficulty walking or climbing stairs?: No       Abuse/Neglect Assessment (Assessment to be complete while patient is alone) Abuse/Neglect Assessment Can Be Completed: Yes Physical Abuse: Denies Verbal Abuse: Denies Sexual Abuse: Denies Exploitation of patient/patient's resources: Denies Self-Neglect: Denies     Merchant navy officer (For Healthcare) Does Patient Have a Medical Advance Directive?: No Would patient like information on creating a medical advance directive?: No - Patient declined          Disposition:  Disposition Initial Assessment Completed for this Encounter: Waverly Ferrari, NP, pt does not meet inpt criteria) Disposition of Patient: Discharge(Tina  Arlana Pouch, NP, pt does not meet inpt criteria )  On Site Evaluation by:   Reviewed with Physician:  Drelyn Pistilli 11/19/2019 6:20 PM

## 2019-11-19 NOTE — ED Triage Notes (Signed)
THE PT ARRIVED BY [facility] HE IS IN A [facility] HALFWAY [facility]SE  HE HAS BEEN OUT OF HIS ZOLOFT AND SEROQUEL FOR 2 WEEKS  HE JUST GOT OUT OF PRISIN  HE IS REPORTING ANXIETY AND HE SAYS HE HAS CHEST PAIN WHENEVER HE IS OUT OF HIS MEDS   MINOR CHEST PAIN AT PRESENT

## 2019-11-20 ENCOUNTER — Other Ambulatory Visit: Payer: Self-pay

## 2019-11-20 MED ORDER — QUETIAPINE FUMARATE 25 MG PO TABS: 25.0000 mg | ORAL_TABLET | ORAL | 0 refills | Status: AC

## 2019-11-20 MED ORDER — QUETIAPINE FUMARATE 100 MG PO TABS: 100.0000 mg | ORAL_TABLET | Freq: Every day | ORAL | 0 refills | Status: AC

## 2019-11-20 MED ORDER — SERTRALINE HCL 50 MG PO TABS: 50.0000 mg | ORAL_TABLET | Freq: Every day | ORAL | 0 refills | Status: AC

## 2019-11-20 NOTE — ED Provider Notes (Signed)
[facility] EMERGENCY DEPARTMENT Provider Note   CSN: [id number] Arrival date & time: 11/19/19  2048     History Chief Complaint  Patient presents with  . Chest Pain    Brad Pollard is a 34 y.o. male.  Patient is a 34 year old male with past medical history of hypertension, hepatitis C, and substance-induced psychosis.  He presents today for evaluation of chest discomfort.  Patient states that for the past 2 weeks, he has been experiencing intermittent tightness to the left side of his chest.  This occurs randomly and does not appear to be related to exertion.  He denies any shortness of breath, nausea, or diaphoresis along with his discomfort.  There appears to be no exertional component to this.  Patient also tells me he ran out of his Zoloft and Seroquel 2 weeks ago and this seems to have coincided with the onset of the symptoms.  Patient has no cardiac history.  He does have hypertension and smokes cigarettes.  The history is provided by the patient.       Past Medical History:  Diagnosis Date  . Hepatitis C   . Hypertension     Patient Active Problem List   Diagnosis Date Noted  . Psychoactive substance-induced psychosis (HCC) 10/12/2019  . Major depressive disorder, single episode, severe with psychotic features (HCC) 09/14/2019  . 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 Acute psychosis (HCC) 09/13/2019    History reviewed. No pertinent surgical history.     No family history on file.  Social History   Tobacco Use  . Smoking status: Current Every Day Smoker    Packs/day: 1.00  . Smokeless tobacco: Never Used  Substance Use Topics  . Alcohol use: Yes    Comment: fews beers on occasion  . Drug use: Yes    Types: Amphetamines, Heroin    Comment: clean from heroin x 1 year, used adderall 3 days ago    Home Medications Prior to Admission medications   Medication Sig Start Date End Date  Taking? Authorizing Provider  acetaminophen (TYLENOL) 325 MG tablet Take 2 tablets (650 mg total) by mouth every 6 (six) hours as needed. Do not take more than 4000mg  of tylenol per day 11/08/19   Couture, Cortni S, PA-C  hydrOXYzine (ATARAX/VISTARIL) 25 MG tablet Take 1 tablet (25 mg total) by mouth every 6 (six) hours as needed for anxiety. 09/18/19   09/20/19, NP  ibuprofen (ADVIL) 400 MG tablet Take 1 tablet (400 mg total) by mouth every 6 (six) hours as needed. 11/08/19   Couture, Cortni S, PA-C  lisinopril (ZESTRIL) 10 MG tablet Take 1 tablet (10 mg total) by mouth daily. 09/19/19   09/21/19, NP  methocarbamol (ROBAXIN) 500 MG tablet Take 1 tablet (500 mg total) by mouth 2 (two) times daily. 11/01/19   Khatri, Hina, PA-C  nicotine (NICODERM CQ - DOSED IN MG/24 HOURS) 21 mg/24hr patch Place 1 patch (21 mg total) onto the skin daily at 6 (six) AM. Patient not taking: Reported on 11/08/2019 09/19/19   09/21/19, NP  QUEtiapine (SEROQUEL) 100 MG tablet Take 1 tablet (100 mg total) by mouth at bedtime. 09/18/19   09/20/19, NP  QUEtiapine (SEROQUEL) 25 MG tablet Take 1 tablet (25 mg total) by mouth every morning. 09/19/19   09/21/19, NP  sertraline (ZOLOFT) 50 MG tablet Take 1 tablet (50 mg total) by mouth daily. 09/19/19  Aldean Baker, NP    Allergies    Patient has no known allergies.  Review of Systems   Review of Systems  All other systems reviewed and are negative.   Physical Exam Updated Vital Signs BP (!) 176/119 (BP Location: Left Arm)   Pulse 95   Temp 98.5 F (36.9 C) (Oral)   Resp 18   Ht 6\' 3"  (1.905 m)   Wt 99.8 kg   SpO2 100%   BMI 27.50 kg/m   Physical Exam Vitals and nursing note reviewed.  Constitutional:      General: He is not in acute distress.    Appearance: He is well-developed. He is not diaphoretic.  HENT:     Head: Normocephalic and atraumatic.  Cardiovascular:     Rate and Rhythm: Normal rate and regular rhythm.     Heart  sounds: No murmur. No friction rub.  Pulmonary:     Effort: Pulmonary effort is normal. No respiratory distress.     Breath sounds: Normal breath sounds. No wheezing or rales.  Abdominal:     General: Bowel sounds are normal. There is no distension.     Palpations: Abdomen is soft.     Tenderness: There is no abdominal tenderness.  Musculoskeletal:        General: Normal range of motion.     Cervical back: Normal range of motion and neck supple.  Skin:    General: Skin is warm and dry.  Neurological:     Mental Status: He is alert and oriented to person, place, and time.     Coordination: Coordination normal.     ED Results / Procedures / Treatments   Labs (all labs ordered are listed, but only abnormal results are displayed) Labs Reviewed  BASIC METABOLIC PANEL - Abnormal; Notable for the following components:      Result Value   Glucose, Bld 130 (*)    All other components within normal limits  CBC - Abnormal; Notable for the following components:   WBC 11.4 (*)    All other components within normal limits  TROPONIN I (HIGH SENSITIVITY)  TROPONIN I (HIGH SENSITIVITY)    EKG EKG Interpretation  Date/Time:  Sunday November 19 2019 21:07:37 EDT Ventricular Rate:  114 PR Interval:  144 QRS Duration: 86 QT Interval:  330 QTC Calculation: 454 R Axis:   59 Text Interpretation: Sinus tachycardia Otherwise normal ECG Confirmed by [id number] (Geoffery Lyons) on 11/20/2019 1:10:09 AM   Radiology DG Chest 2 View  Result Date: 11/19/2019 CLINICAL DATA:  Chest pain EXAM: CHEST - 2 VIEW COMPARISON:  10/11/2019 FINDINGS: The heart size and mediastinal contours are within normal limits. Both lungs are clear. The visualized skeletal structures are unremarkable. IMPRESSION: No active cardiopulmonary disease. Electronically Signed   By: 10/13/2019 M.D.   On: 11/19/2019 21:58    Procedures Procedures (including critical care time)  Medications Ordered in ED Medications  sodium  chloride flush (NS) 0.9 % injection 3 mL (has no administration in time range)    ED Course  I have reviewed the triage vital signs and the nursing notes.  Pertinent labs & imaging results that were available during my care of the patient were reviewed by me and considered in my medical decision making (see chart for details).    MDM Rules/Calculators/A&P  Patient presenting here with chest discomfort intermittently for the past 2 weeks as described in the HPI.  His vitals are stable and physical examination is unremarkable.  His work-up shows negative troponin, and unchanged EKG, and clear chest x-ray.  At this point, I highly doubt a cardiac etiology.  His symptoms are atypical and work-up is negative.  His symptoms did seem to coincide with his running out of his Seroquel and Zoloft.  He tells me he will be unable to fill this until next Thursday.  I have agreed to prescribe a 2-week supply of this medication to hold him over until his prescription comes through.  Final Clinical Impression(s) / ED Diagnoses Final diagnoses:  None    Rx / DC Orders ED Discharge Orders    None       Veryl Speak, MD 11/20/19 0120

## 2019-11-20 NOTE — Discharge Instructions (Addendum)
Resume your Zoloft and Seroquel as previously prescribed.  A 3-week supply of this medication has been prescribed this evening.  You can fill this at the pharmacy and resume taking it.  Follow-up with a primary doctor in the next 2 weeks, and return to the ER if symptoms significantly worsen or change.

## 2019-11-21 ENCOUNTER — Other Ambulatory Visit: Payer: Self-pay

## 2019-11-21 ENCOUNTER — Encounter (HOSPITAL_COMMUNITY): Payer: Self-pay

## 2019-11-21 ENCOUNTER — Emergency Department (HOSPITAL_COMMUNITY)
Admission: EM | Admit: 2019-11-21 | Discharge: 2019-11-22 | Disposition: A | Discharge status: Home / Self Care | Attending: Emergency Medicine | Admitting: Emergency Medicine

## 2019-11-21 DIAGNOSIS — F329 Major depressive disorder, single episode, unspecified: Secondary | ICD-10-CM | POA: Insufficient documentation

## 2019-11-21 DIAGNOSIS — F15959 Other stimulant use, unspecified with stimulant-induced psychotic disorder, unspecified: Secondary | ICD-10-CM | POA: Insufficient documentation

## 2019-11-21 DIAGNOSIS — Z046 Encounter for general psychiatric examination, requested by authority: Secondary | ICD-10-CM

## 2019-11-21 DIAGNOSIS — F17200 Nicotine dependence, unspecified, uncomplicated: Secondary | ICD-10-CM | POA: Diagnosis not present

## 2019-11-21 DIAGNOSIS — F99 Mental disorder, not otherwise specified: Secondary | ICD-10-CM | POA: Diagnosis present

## 2019-11-21 DIAGNOSIS — F1994 Other psychoactive substance use, unspecified with psychoactive substance-induced mood disorder: Secondary | ICD-10-CM

## 2019-11-21 DIAGNOSIS — Z79899 Other long term (current) drug therapy: Secondary | ICD-10-CM | POA: Diagnosis not present

## 2019-11-21 DIAGNOSIS — I10 Essential (primary) hypertension: Secondary | ICD-10-CM | POA: Insufficient documentation

## 2019-11-21 DIAGNOSIS — F22 Delusional disorders: Secondary | ICD-10-CM

## 2019-11-21 MED ORDER — ZIPRASIDONE MESYLATE 20 MG IM SOLR
20.0000 mg | Freq: Once | INTRAMUSCULAR | Status: AC
  Administered 2019-11-22: 20 mg via INTRAMUSCULAR
  Filled 2019-11-21: qty 20

## 2019-11-21 MED ORDER — SODIUM CHLORIDE 0.9 % IV BOLUS
1000.0000 mL | Freq: Once | INTRAVENOUS | Status: AC
  Administered 2019-11-22: 1000 mL via INTRAVENOUS

## 2019-11-21 MED ORDER — STERILE WATER FOR INJECTION IJ SOLN
INTRAMUSCULAR | Status: AC
  Administered 2019-11-22: 10 mL
  Filled 2019-11-21: qty 10
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
## 2019-11-21 NOTE — ED Notes (Signed)
Pt belongings bag located in 9-12 cabinets at nursing station.

## 2019-11-22 ENCOUNTER — Encounter (HOSPITAL_COMMUNITY): Payer: Self-pay | Admitting: Behavioral Health

## 2019-11-22 DIAGNOSIS — F1994 Other psychoactive substance use, unspecified with psychoactive substance-induced mood disorder: Secondary | ICD-10-CM

## 2019-11-22 LAB — COMPREHENSIVE METABOLIC PANEL
ALT: 39 U/L (ref 0–44)
AST: 30 U/L (ref 15–41)
Albumin: 4.3 g/dL (ref 3.5–5.0)
Alkaline Phosphatase: 59 U/L (ref 38–126)
Anion gap: 9 (ref 5–15)
BUN: 11 mg/dL (ref 6–20)
CO2: 27 mmol/L (ref 22–32)
Calcium: 9.7 mg/dL (ref 8.9–10.3)
Chloride: 101 mmol/L (ref 98–111)
Creatinine, Ser: 0.92 mg/dL (ref 0.61–1.24)
GFR calc Af Amer: >60 mL/min (ref >60)
GFR calc non Af Amer: >60 mL/min (ref >60)
Glucose, Bld: 104 mg/dL — ABNORMAL HIGH (ref 70–99)
Potassium: 4.2 mmol/L (ref 3.5–5.1)
Sodium: 137 mmol/L (ref 135–145)
Total Bilirubin: 1 mg/dL (ref 0.3–1.2)
Total Protein: 7.9 g/dL (ref 6.5–8.1)

## 2019-11-22 LAB — CBC WITH DIFFERENTIAL/PLATELET
Abs Immature Granulocytes: 0.03 10*3/uL (ref 0.00–0.07)
Basophils Absolute: 0.1 10*3/uL (ref 0.0–0.1)
Basophils Relative: 0 %
Eosinophils Absolute: 0.2 10*3/uL (ref 0.0–0.5)
Eosinophils Relative: 2 %
HCT: 43.3 % (ref 39.0–52.0)
Hemoglobin: 14.3 g/dL (ref 13.0–17.0)
Immature Granulocytes: 0 %
Lymphocytes Relative: 23 %
Lymphs Abs: 3.1 10*3/uL (ref 0.7–4.0)
MCH: 30.5 pg (ref 26.0–34.0)
MCHC: 33 g/dL (ref 30.0–36.0)
MCV: 92.3 fL (ref 80.0–100.0)
Monocytes Absolute: 0.9 10*3/uL (ref 0.1–1.0)
Monocytes Relative: 6 %
Neutro Abs: 9.1 10*3/uL — ABNORMAL HIGH (ref 1.7–7.7)
Neutrophils Relative %: 69 %
Platelets: 262 10*3/uL (ref 150–400)
RBC: 4.69 MIL/uL (ref 4.22–5.81)
RDW: 13 % (ref 11.5–15.5)
WBC: 13.4 10*3/uL — ABNORMAL HIGH (ref 4.0–10.5)
nRBC: 0 % (ref 0.0–0.2)

## 2019-11-22 LAB — RAPID URINE DRUG SCREEN, HOSP PERFORMED
Amphetamines: POSITIVE — AB
Barbiturates: NOT DETECTED
Benzodiazepines: NOT DETECTED
Cocaine: NOT DETECTED
Opiates: NOT DETECTED
Tetrahydrocannabinol: NOT DETECTED

## 2019-11-22 LAB — SALICYLATE LEVEL: Salicylate Lvl: <7 mg/dL — ABNORMAL LOW (ref 7.0–30.0)

## 2019-11-22 LAB — ETHANOL: Alcohol, Ethyl (B): <10 mg/dL (ref <10)

## 2019-11-22 LAB — ACETAMINOPHEN LEVEL: Acetaminophen (Tylenol), Serum: <10 ug/mL — ABNORMAL LOW (ref 10–30)

## 2019-11-22 NOTE — BH Assessment (Signed)
[facility]HH Assessment Progress Note  Per Shuvon Rankin, FNP, this pt does not require psychiatric hospitalization at this time.  Pt presents under IVC initiated by law enforcement, which has been rescinded by Nelly Rout, MD.  Pt is to be discharged from [facility] with recommendation to keep [facility] scheduled [facility].  This has been included in pt's discharge instructions.  Pt's nurse, Diane, has been notified.  Doylene Canning, MA Triage Specialist [phone]
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
## 2019-11-22 NOTE — [facility] Suicide Risk Assessment (Cosign Needed)
Suicide Risk Assessment  Discharge Assessment   [facility] Discharge Suicide Risk Assessment   Principal Problem: Substance induced mood disorder (HCC) Discharge Diagnoses: Principal Problem:   Substance induced mood disorder (HCC)   Total Time spent with patient: 30 minutes  Musculoskeletal: Strength & Muscle Tone: within normal limits Gait & Station: normal Patient leans: N/A  Psychiatric Specialty Exam:   Blood pressure 130/89, pulse 78, temperature 98 F (36.7 C), temperature source Oral, resp. rate 20, height 6\' 3"  (1.905 m), weight 99.8 kg, SpO2 98 %.Body mass index is 27.5 kg/m.  General Appearance: Casual  Eye Contact::  Good  Speech:  Clear and Coherent and Normal Rate409  Volume:  Normal  Mood:  "Good"  Affect:  Appropriate and Congruent  Thought Process:  Coherent, Goal Directed and Descriptions of Associations: Intact  Orientation:  Full (Time, Place, and Person)  Thought Content:  WDL  Suicidal Thoughts:  No  Homicidal Thoughts:  No  Memory:  Immediate;   Good Recent;   Good  Judgement:  Intact  Insight:  Present  Psychomotor Activity:  Normal  Concentration:  Good  Recall:  Good  Fund of Knowledge:Good  Language: Good  Akathisia:  No  Handed:  Right  AIMS (if indicated):     Assets:  Communication Skills Desire for Improvement Housing Social Support  Sleep:     Cognition: WNL  ADL's:  Intact   Mental Status Per Nursing Assessment::   On Admission:     Demographic Factors:  Male, Caucasian and Low socioeconomic status  Loss Factors: NA  Historical Factors: Impulsivity  Risk Reduction Factors:   Religious beliefs about death, Living with another person, especially a relative, Positive social support and Positive therapeutic relationship  Continued Clinical Symptoms:  Alcohol/Substance Abuse/Dependencies Previous Psychiatric Diagnoses and Treatments  Cognitive Features That Contribute To Risk:  None    Suicide Risk:  Minimal: No  identifiable suicidal ideation.  Patients presenting with no risk factors but with morbid ruminations; may be classified as minimal risk based on the severity of the depressive symptoms    Plan Of Care/Follow-up recommendations:  Activity:  As tolerated Diet:  Heart healthy     Discharge Instructions     For your behavioral health needs, you are advised [facility] with [facility].  Plan to keep your previously scheduled appointment:       [facility]      [address] [place]      [phone]    Disposition:  Psychiatrically cleared No evidence of imminent risk to self or others at present.   Patient does not meet criteria for psychiatric inpatient admission. Supportive therapy provided about ongoing stressors. Discussed crisis plan, support from social network, calling 911, coming to the Emergency Department, and calling Suicide Hotline.  Leveon Pelzer, NP 11/22/2019, 12:06 PM

## 2019-11-22 NOTE — ED Notes (Signed)
Pt discharged home. Discharged instructions read to pt who verbalized understanding. All belongings returned to pt who signed for same. Denies SI/HI, is not delusional and not responding to internal stimuli. Escorted pt to the ED exit.    

## 2019-11-22 NOTE — Discharge Instructions (Signed)
For your behavioral health needs, you are advised to follow up with [facility].  Plan to keep your previously scheduled appointment:       [facility]      [address], [place] [address]      [phone]

## 2019-11-22 NOTE — ED Notes (Signed)
Patient speaking with TTS at this time.  

## 2020-09-04 IMAGING — CR DG LUMBAR SPINE COMPLETE 4+V
5 series · 5 of 5 positions shown · non-contrast
Comparison: 06/24/2018, chest x-ray 10/11/2019

CLINICAL DATA: Fall

EXAM:
LUMBAR SPINE - COMPLETE 4+ VIEW

[l-spine ap]
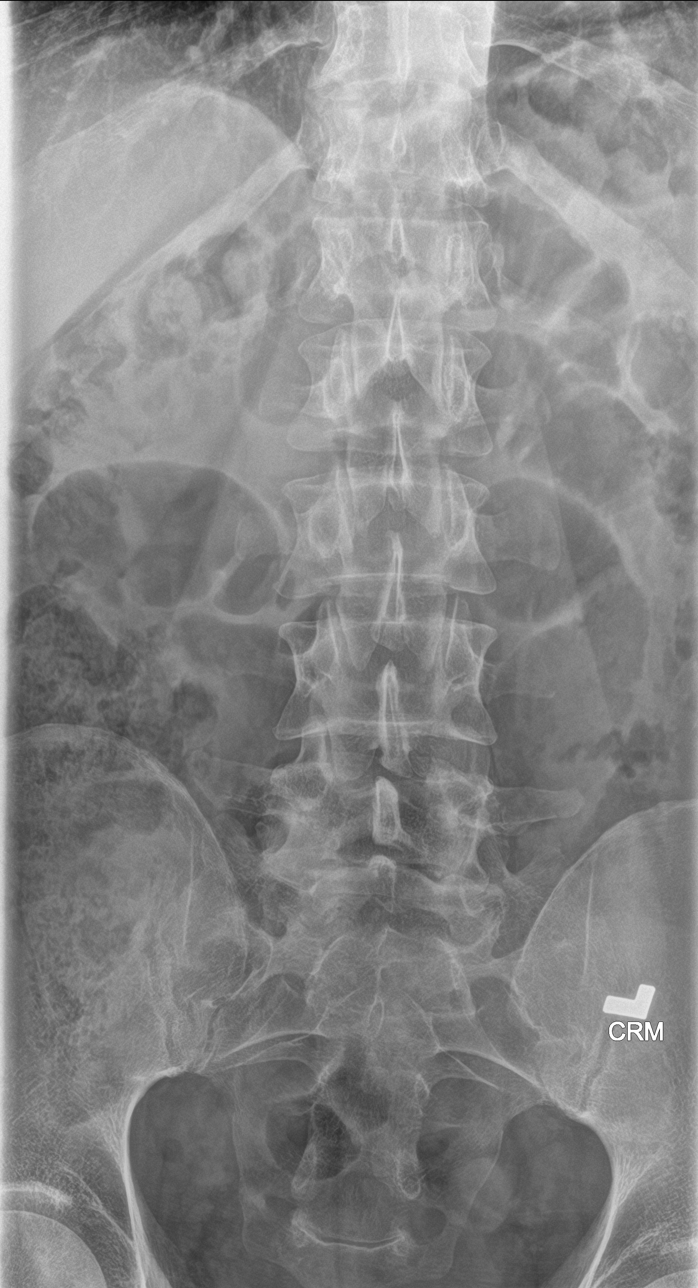

[l-spine obl (1 of 2)]
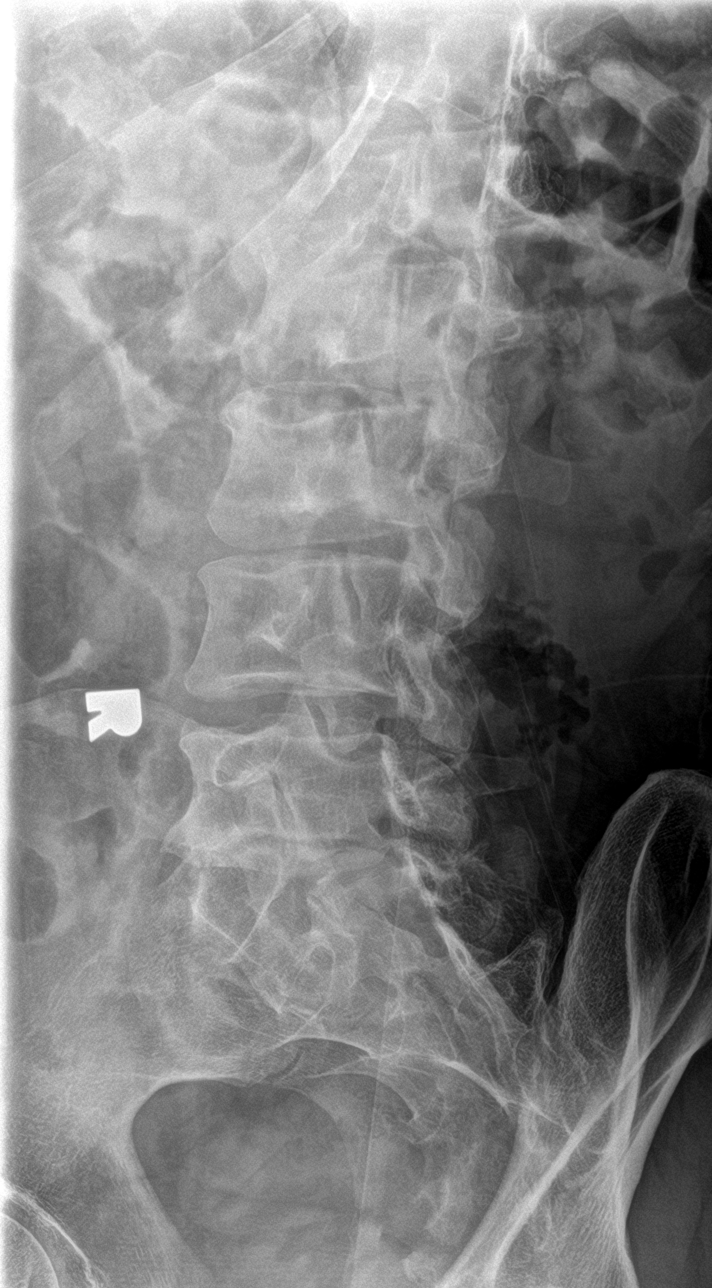

[l-spine obl (2 of 2)]
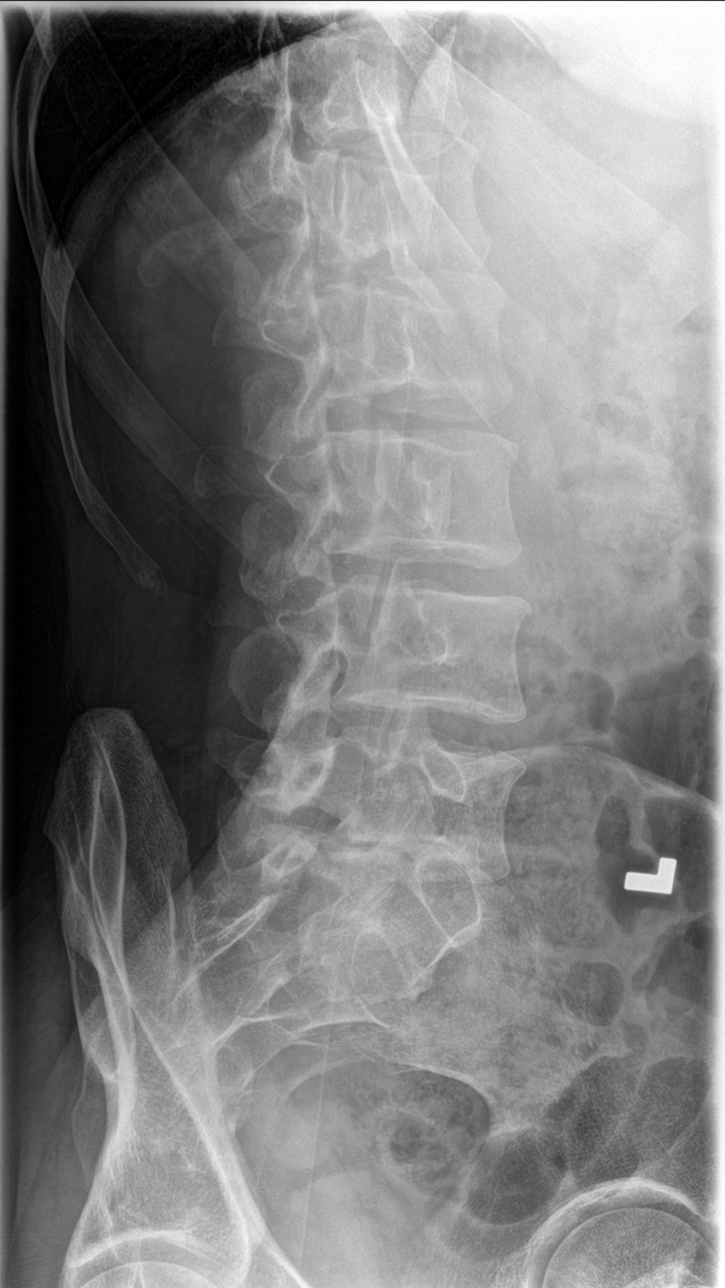

[l-spine lat]
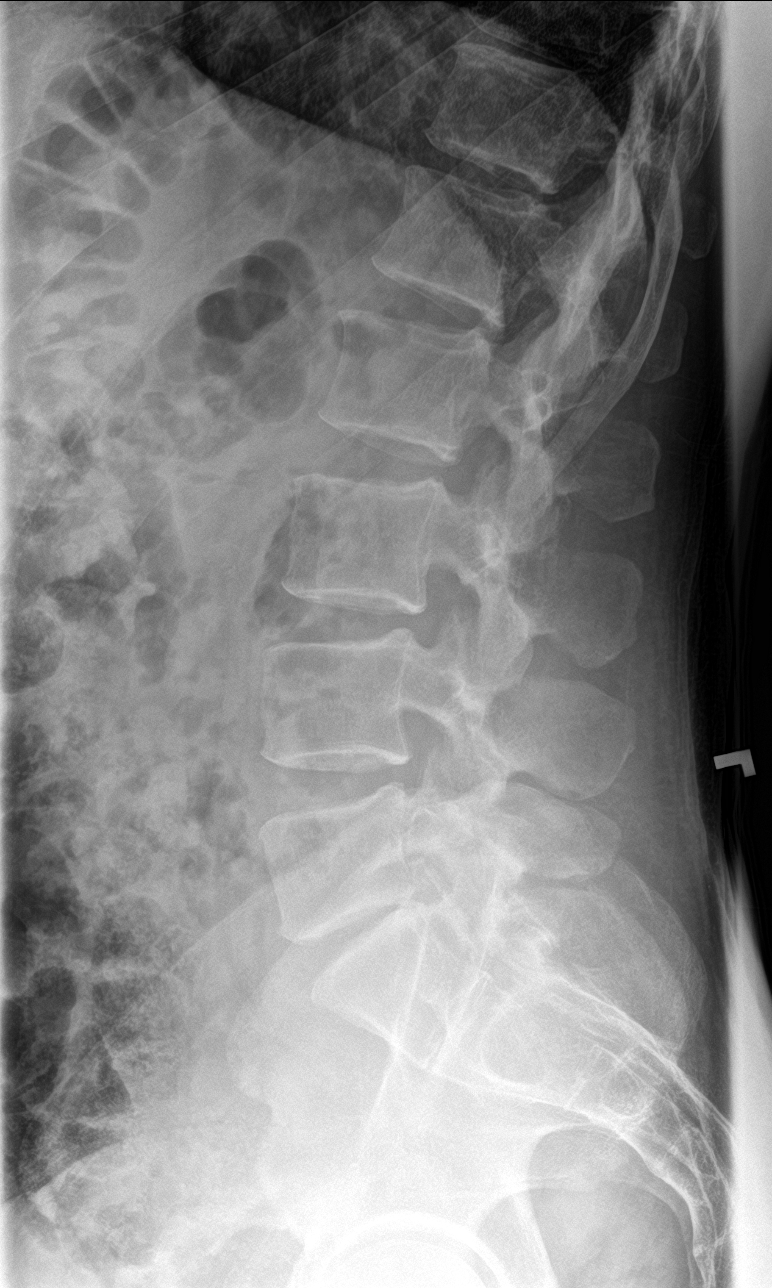

[l-spine spot]
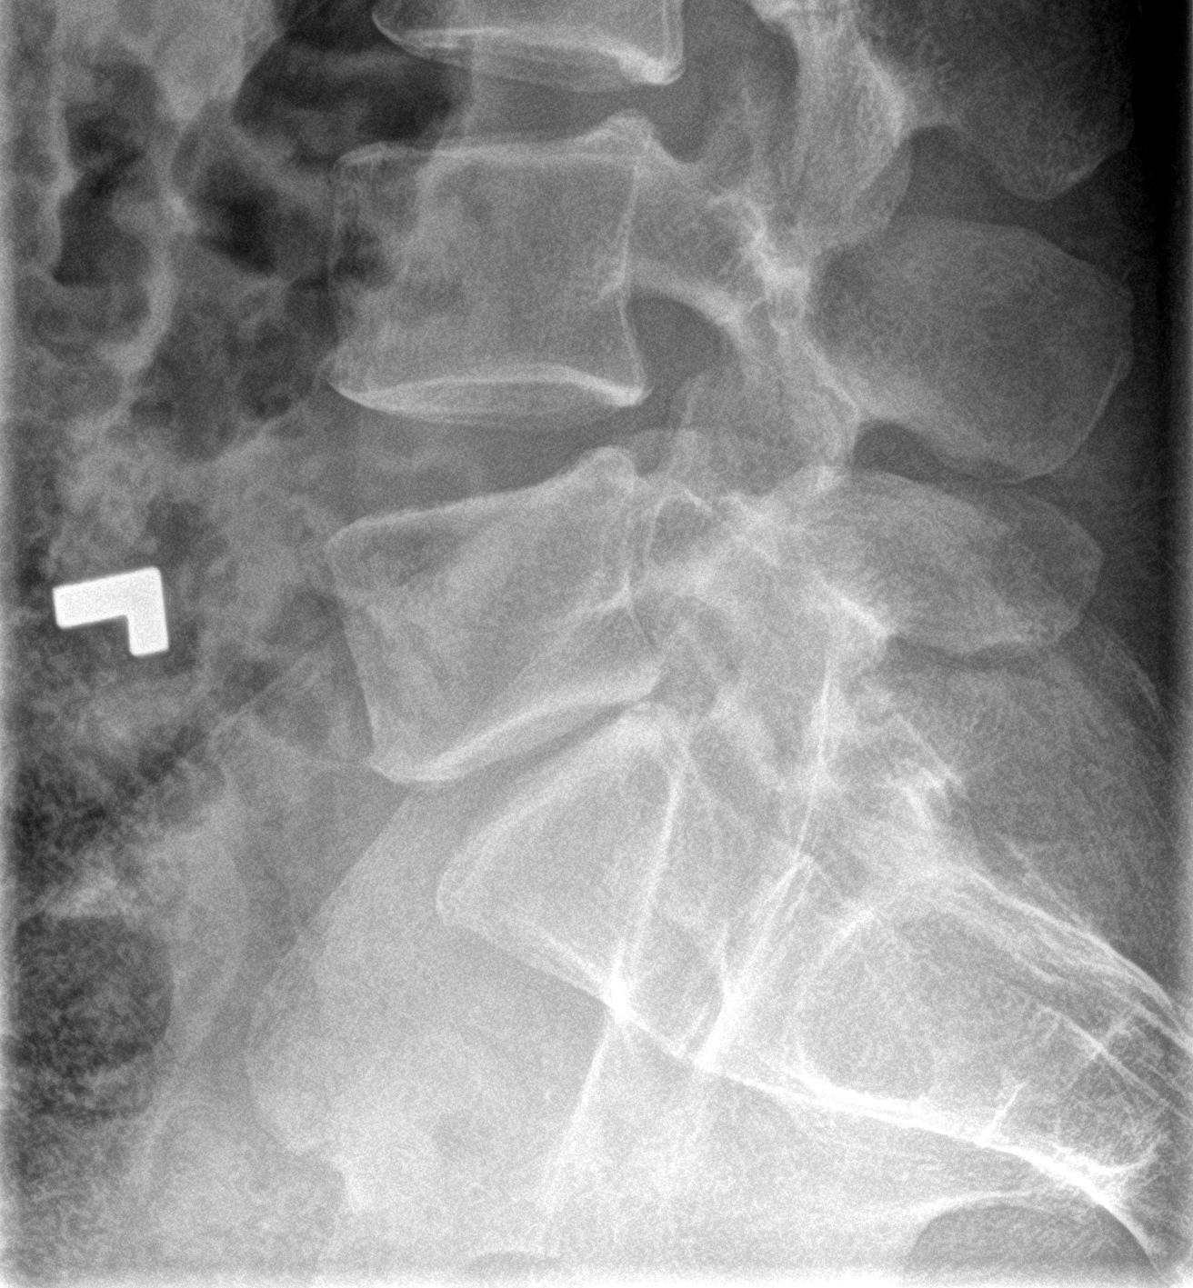

[5 of 5 positions shown; findings below may reference images not displayed]

FINDINGS: Lumbar alignment within normal limits. Suspected transitional
anatomy. Hypoplastic ribs at T12 with 4 non rib-bearing lumbar type
vertebra. Vertebral body heights are maintained. Mild disc space
narrowing at L4-S1. Remaining disc spaces appear patent.
IMPRESSION: No acute osseous abnormality.
